# Patient Record
Sex: Female | Born: 1965
Health system: Southern US, Community
[De-identification: ages and names within clinical notes are randomized; demographics above are authoritative.]

## PROBLEM LIST (undated history)

## (undated) DIAGNOSIS — O99345 Other mental disorders complicating the puerperium: Secondary | ICD-10-CM

## (undated) DIAGNOSIS — D1803 Hemangioma of intra-abdominal structures: Secondary | ICD-10-CM

## (undated) DIAGNOSIS — R87810 Cervical high risk human papillomavirus (HPV) DNA test positive: Secondary | ICD-10-CM

## (undated) DIAGNOSIS — D509 Iron deficiency anemia, unspecified: Secondary | ICD-10-CM

## (undated) DIAGNOSIS — F329 Major depressive disorder, single episode, unspecified: Secondary | ICD-10-CM

## (undated) DIAGNOSIS — F53 Postpartum depression: Secondary | ICD-10-CM

## (undated) HISTORY — DX: Other mental disorders complicating the puerperium: O99.345

## (undated) HISTORY — DX: Cervical high risk human papillomavirus (HPV) DNA test positive: R87.810

## (undated) HISTORY — PX: ECTOPIC PREGNANCY SURGERY: SHX613

## (undated) HISTORY — DX: Iron deficiency anemia, unspecified: D50.9

## (undated) HISTORY — DX: Major depressive disorder, single episode, unspecified: F32.9

## (undated) HISTORY — DX: Postpartum depression: F53.0

## (undated) HISTORY — DX: Hemangioma of intra-abdominal structures: D18.03

---

## 1998-03-15 DIAGNOSIS — F53 Postpartum depression: Secondary | ICD-10-CM

## 1998-03-15 HISTORY — DX: Postpartum depression: F53.0

## 1998-11-06 ENCOUNTER — Inpatient Hospital Stay (HOSPITAL_COMMUNITY): Admission: AD | Admit: 1998-11-06 | Discharge: 1998-11-08 | Payer: Self-pay | Admitting: Obstetrics & Gynecology

## 1999-05-13 ENCOUNTER — Other Ambulatory Visit: Admission: RE | Admit: 1999-05-13 | Discharge: 1999-05-13 | Payer: Self-pay | Admitting: Obstetrics and Gynecology

## 2000-12-27 ENCOUNTER — Other Ambulatory Visit: Admission: RE | Admit: 2000-12-27 | Discharge: 2000-12-27 | Payer: Self-pay | Admitting: Obstetrics and Gynecology

## 2004-07-11 ENCOUNTER — Emergency Department (HOSPITAL_COMMUNITY): Admission: EM | Admit: 2004-07-11 | Discharge: 2004-07-11 | Payer: Self-pay | Admitting: Emergency Medicine

## 2007-12-28 ENCOUNTER — Other Ambulatory Visit: Admission: RE | Admit: 2007-12-28 | Discharge: 2007-12-28 | Payer: Self-pay | Admitting: Family Medicine

## 2010-04-05 ENCOUNTER — Encounter: Payer: Self-pay | Admitting: Family Medicine

## 2012-04-21 ENCOUNTER — Emergency Department (HOSPITAL_COMMUNITY)
Admission: EM | Admit: 2012-04-21 | Discharge: 2012-04-21 | Disposition: A | Discharge status: Home / Self Care | Payer: Managed Care, Other (non HMO) | Attending: Emergency Medicine | Admitting: Emergency Medicine

## 2012-04-21 ENCOUNTER — Encounter (HOSPITAL_COMMUNITY): Payer: Self-pay | Admitting: Emergency Medicine

## 2012-04-21 DIAGNOSIS — M25519 Pain in unspecified shoulder: Secondary | ICD-10-CM | POA: Insufficient documentation

## 2012-04-21 DIAGNOSIS — M542 Cervicalgia: Secondary | ICD-10-CM

## 2012-04-21 DIAGNOSIS — F17200 Nicotine dependence, unspecified, uncomplicated: Secondary | ICD-10-CM | POA: Insufficient documentation

## 2012-04-21 DIAGNOSIS — Z566 Other physical and mental strain related to work: Secondary | ICD-10-CM

## 2012-04-21 DIAGNOSIS — R209 Unspecified disturbances of skin sensation: Secondary | ICD-10-CM | POA: Insufficient documentation

## 2012-04-21 DIAGNOSIS — M79609 Pain in unspecified limb: Secondary | ICD-10-CM | POA: Insufficient documentation

## 2012-04-21 DIAGNOSIS — M62838 Other muscle spasm: Secondary | ICD-10-CM

## 2012-04-21 DIAGNOSIS — Z79899 Other long term (current) drug therapy: Secondary | ICD-10-CM | POA: Insufficient documentation

## 2012-04-21 DIAGNOSIS — F1010 Alcohol abuse, uncomplicated: Secondary | ICD-10-CM | POA: Insufficient documentation

## 2012-04-21 MED ORDER — IBUPROFEN 800 MG PO TABS: 800.0000 mg | ORAL_TABLET | Freq: Three times a day (TID) | ORAL | Status: DC

## 2012-04-21 MED ORDER — CYCLOBENZAPRINE HCL 10 MG PO TABS: 10.0000 mg | ORAL_TABLET | Freq: Two times a day (BID) | ORAL | Status: DC | PRN

## 2012-04-21 NOTE — ED Provider Notes (Signed)
History   This chart was scribed for non-physician practitioner working with Celene Kras, MD by Leone Payor, ED Scribe. This patient was seen in room WTR6/WTR6 and the patient's care was started at 2104.   CSN: [id number]  Arrival date & time 04/21/12  2104   First MD Initiated Contact with Patient 04/21/12 2141      Chief Complaint  Patient presents with  . Torticollis     The history is provided by the patient. No language interpreter was used.    Priscilla Bautista is a 47 y.o. female who presents to the Emergency Department complaining of constant, gradually worsening R sided neck stiffness with shoulder and R arm pain with numbness and tingling to R hand starting 7-10 days ago. Pt denies recent injury to the area. States pain is better with alcohol. She has had 3-4 drinks tonight. Pain prior to coming to ED was 3-4/10 but currently is 4-5/10. Has an associated HA but denies fever, chills. States she is under a great amount of stress at work and is constantly straining her neck and eyes looking at a computer screen and blackberry. She has an appointment tomorrow for a massage.   Pt is a current everyday smoker and daily alcohol user.  History reviewed. No pertinent past medical history.  Past Surgical History  Procedure Date  . Ectopic pregnancy surgery     No family history on file.  History  Substance Use Topics  . Smoking status: Current Every Day Smoker  . Smokeless tobacco: Not on file  . Alcohol Use: Yes     Comment: daily    No OB history provided.   Review of Systems A complete 10 system review of systems was obtained and all systems are negative except as noted in the HPI and PMH.    Allergies  Review of patient's allergies indicates no known allergies.  Home Medications   Current Outpatient Rx  Name  Route  Sig  Dispense  Refill  . ACETAMINOPHEN 500 MG PO TABS   Oral   Take 1,000 mg by mouth every 6 (six) hours as needed. For pain         . ASPIRIN  325 MG PO TABS   Oral   Take 650 mg by mouth 2 (two) times daily as needed. For pain         . IBUPROFEN 200 MG PO TABS   Oral   Take 400 mg by mouth every 6 (six) hours as needed. For pain         . ADULT MULTIVITAMIN W/MINERALS CH   Oral   Take 1 tablet by mouth every morning.         Marland Kitchen VITAMIN B-12 500 MCG PO TABS   Oral   Take 500 mcg by mouth every morning.           BP 136/94  Pulse 87  Temp 97.7 F (36.5 C) (Oral)  Resp 18  Ht 5\' 7"  (1.702 m)  Wt 175 lb (79.379 kg)  BMI 27.41 kg/m2  SpO2 98%  LMP 03/21/2012  Physical Exam  Nursing note and vitals reviewed. Constitutional: She is oriented to person, place, and time. She appears well-developed and well-nourished. No distress.       ETOH odor  HENT:  Head: Normocephalic and atraumatic.  Eyes: Conjunctivae normal and EOM are normal. Pupils are equal, round, and reactive to light.  Neck: Normal range of motion. Neck supple. No spinous process tenderness present.  No rigidity. No tracheal deviation and no edema present. No Brudzinski's sign and no Kernig's sign noted.  Cardiovascular: Normal rate, regular rhythm, normal heart sounds and intact distal pulses.   Pulmonary/Chest: Effort normal and breath sounds normal. No respiratory distress. She has no wheezes. She has no rales. She exhibits no tenderness.  Musculoskeletal: Normal range of motion.       Cervical back: She exhibits tenderness. She exhibits normal range of motion, no bony tenderness and normal pulse.       Tenderness to palpation of bilateral trapezius muscle with spasm.  Neurological: She is alert and oriented to person, place, and time. She has normal strength. No sensory deficit.  Skin: Skin is warm and dry.  Psychiatric: She has a normal mood and affect. Her behavior is normal.    ED Course  Procedures (including critical care time)  DIAGNOSTIC STUDIES: Oxygen Saturation is 98% on room air, normal by my interpretation.    COORDINATION OF  CARE:  9:52 PM Discussed treatment plan which includes muscle relaxer, ibuprofen with pt at bedside and pt agreed to plan.    Labs Reviewed - No data to display No results found.   1. Muscle spasm   2. Stress at work   3. Neck pain       MDM  47 y/o female under a lot of stress at work presenting with neck spasms. No red flags concerning patient's neck pain. No focal neurologic deficits. Negative meningeal signs. Patient is currently slightly intoxicated. She has a massage scheduled tomorrow. I advised rest and heat. I prescribed her Flexeril and ibuprofen. I explained to her that I could not give her Flexeril the emergency department since she did admit to drinking a good amount of alcohol tonight. Return precautions discussed. Patient states her understanding of plan and is agreeable.   I personally performed the services described in this documentation, which was scribed in my presence. The recorded information has been reviewed and is accurate.     Trevor Mace, PA-C 04/21/12 2211

## 2012-04-21 NOTE — ED Provider Notes (Signed)
Medical screening examination/treatment/procedure(s) were performed by non-physician practitioner and as supervising physician I was immediately available for consultation/collaboration.   Celene Kras, MD 04/21/12 2218

## 2012-04-21 NOTE — ED Notes (Signed)
Pt c/o R sided neck stiffness with shoulder and R arm pain with numbness/tingling to R hand. Pt states pain worse when lifts R arm, then feels numbness to R elbow, onset 7-10 days. Pt denies injury. Pt also c/o intermittent blurred vision and HA.  Pt denies n/v/d, denies fever, denies diaphoresis or SHOB.

## 2013-03-15 DIAGNOSIS — D1803 Hemangioma of intra-abdominal structures: Secondary | ICD-10-CM

## 2013-03-15 HISTORY — DX: Hemangioma of intra-abdominal structures: D18.03

## 2013-08-13 DIAGNOSIS — R87810 Cervical high risk human papillomavirus (HPV) DNA test positive: Secondary | ICD-10-CM

## 2013-08-13 HISTORY — DX: Cervical high risk human papillomavirus (HPV) DNA test positive: R87.810

## 2013-09-12 DIAGNOSIS — D509 Iron deficiency anemia, unspecified: Secondary | ICD-10-CM

## 2013-09-12 DIAGNOSIS — F32A Depression, unspecified: Secondary | ICD-10-CM

## 2013-09-12 HISTORY — DX: Depression, unspecified: F32.A

## 2013-09-12 HISTORY — DX: Iron deficiency anemia, unspecified: D50.9

## 2013-10-04 ENCOUNTER — Ambulatory Visit (INDEPENDENT_AMBULATORY_CARE_PROVIDER_SITE_OTHER): Payer: Managed Care, Other (non HMO) | Admitting: Family Medicine

## 2013-10-04 ENCOUNTER — Encounter: Payer: Self-pay | Admitting: Family Medicine

## 2013-10-04 ENCOUNTER — Other Ambulatory Visit: Payer: Self-pay | Admitting: Family Medicine

## 2013-10-04 VITALS — BP 120/88 | HR 80 | Ht 65.25 in | Wt 195.0 lb

## 2013-10-04 DIAGNOSIS — F3289 Other specified depressive episodes: Secondary | ICD-10-CM

## 2013-10-04 DIAGNOSIS — F325 Major depressive disorder, single episode, in full remission: Secondary | ICD-10-CM | POA: Insufficient documentation

## 2013-10-04 DIAGNOSIS — R5381 Other malaise: Secondary | ICD-10-CM

## 2013-10-04 DIAGNOSIS — J309 Allergic rhinitis, unspecified: Secondary | ICD-10-CM | POA: Insufficient documentation

## 2013-10-04 DIAGNOSIS — F329 Major depressive disorder, single episode, unspecified: Secondary | ICD-10-CM

## 2013-10-04 DIAGNOSIS — Z23 Encounter for immunization: Secondary | ICD-10-CM

## 2013-10-04 DIAGNOSIS — R5383 Other fatigue: Secondary | ICD-10-CM

## 2013-10-04 DIAGNOSIS — Z0000 Encounter for general adult medical examination without abnormal findings: Secondary | ICD-10-CM

## 2013-10-04 DIAGNOSIS — F17200 Nicotine dependence, unspecified, uncomplicated: Secondary | ICD-10-CM | POA: Insufficient documentation

## 2013-10-04 LAB — COMPREHENSIVE METABOLIC PANEL
ALBUMIN: 4 g/dL (ref 3.5–5.2)
ALK PHOS: 51 U/L (ref 39–117)
ALT: 20 U/L (ref 0–35)
AST: 19 U/L (ref 0–37)
BILIRUBIN TOTAL: 0.3 mg/dL (ref 0.2–1.2)
BUN: 8 mg/dL (ref 6–23)
CO2: 26 mEq/L (ref 19–32)
CREATININE: 0.68 mg/dL (ref 0.50–1.10)
Calcium: 8.8 mg/dL (ref 8.4–10.5)
Chloride: 107 mEq/L (ref 96–112)
Glucose, Bld: 92 mg/dL (ref 70–99)
POTASSIUM: 4.6 meq/L (ref 3.5–5.3)
SODIUM: 141 meq/L (ref 135–145)
Total Protein: 6.6 g/dL (ref 6.0–8.3)

## 2013-10-04 LAB — CBC WITH DIFFERENTIAL/PLATELET
Basophils Absolute: 0 10*3/uL (ref 0.0–0.1)
Basophils Relative: 1 % (ref 0–1)
Eosinophils Absolute: 0.1 10*3/uL (ref 0.0–0.7)
Eosinophils Relative: 3 % (ref 0–5)
HCT: 31.1 % — ABNORMAL LOW (ref 36.0–46.0)
Hemoglobin: 9.4 g/dL — ABNORMAL LOW (ref 12.0–15.0)
Lymphocytes Relative: 32 % (ref 12–46)
Lymphs Abs: 1.4 10*3/uL (ref 0.7–4.0)
MCH: 20 pg — AB (ref 26.0–34.0)
MCHC: 30.2 g/dL (ref 30.0–36.0)
MCV: 66.3 fL — AB (ref 78.0–100.0)
Monocytes Absolute: 0.4 10*3/uL (ref 0.1–1.0)
Monocytes Relative: 8 % (ref 3–12)
NEUTROS PCT: 56 % (ref 43–77)
Neutro Abs: 2.5 10*3/uL (ref 1.7–7.7)
PLATELETS: 352 10*3/uL (ref 150–400)
RBC: 4.69 MIL/uL (ref 3.87–5.11)
RDW: 18.5 % — AB (ref 11.5–15.5)
WBC: 4.4 10*3/uL (ref 4.0–10.5)

## 2013-10-04 LAB — POCT URINALYSIS DIPSTICK
Bilirubin, UA: NEGATIVE
Blood, UA: NEGATIVE
Glucose, UA: NEGATIVE
Ketones, UA: NEGATIVE
Nitrite, UA: NEGATIVE
PROTEIN UA: NEGATIVE
Spec Grav, UA: 1.02
UROBILINOGEN UA: NEGATIVE
pH, UA: 5

## 2013-10-04 LAB — LIPID PANEL
CHOLESTEROL: 129 mg/dL (ref 0–200)
HDL: 62 mg/dL (ref >39)
LDL CALC: 52 mg/dL (ref 0–99)
TRIGLYCERIDES: 74 mg/dL (ref <150)
Total CHOL/HDL Ratio: 2.1 Ratio
VLDL: 15 mg/dL (ref 0–40)

## 2013-10-04 MED ORDER — BUPROPION HCL ER (XL) 150 MG PO TB24: ORAL_TABLET | ORAL | Status: DC

## 2013-10-04 NOTE — Patient Instructions (Addendum)
  HEALTH MAINTENANCE RECOMMENDATIONS:  It is recommended that you get at least 30 minutes of aerobic exercise at least 5 days/week (for weight loss, you may need as much as 60-90 minutes). This can be any activity that gets your heart rate up. This can be divided in 10-15 minute intervals if needed, but try and build up your endurance at least once a week.  Weight bearing exercise is also recommended twice weekly.  Eat a healthy diet with lots of vegetables, fruits and fiber.  "Colorful" foods have a lot of vitamins (ie green vegetables, tomatoes, red peppers, etc).  Limit sweet tea, regular sodas and alcoholic beverages, all of which has a lot of calories and sugar.  Up to 1 alcoholic drink daily may be beneficial for women (unless trying to lose weight, watch sugars).  Drink a lot of water.  Calcium recommendations are 1200-1500 mg daily (1500 mg for postmenopausal women or women without ovaries), and vitamin D 1000 IU daily.  This should be obtained from diet and/or supplements (vitamins), and calcium should not be taken all at once, but in divided doses.  Monthly self breast exams and yearly mammograms for women over the age of 29 is recommended.  Sunscreen of at least SPF 30 should be used on all sun-exposed parts of the skin when outside between the hours of 10 am and 4 pm (not just when at beach or pool, but even with exercise, golf, tennis, and yard work!)  Use a sunscreen that says "broad spectrum" so it covers both UVA and UVB rays, and make sure to reapply every 1-2 hours.  Remember to change the batteries in your smoke detectors when changing your clock times in the spring and fall.  Use your seat belt every time you are in a car, and please drive safely and not be distracted with cell phones and texting while driving.   Please try and quit smoking--start thinking about why/when you smoke (habit, boredom, stress) in order to come up with effective strategies to cut back or quit.  Available resources to help you quit include free counseling through [facility] ([place]Quitline[facility] or 1-800-QUITNOW), smoking cessation classes through [facility] (call to find out schedule), over-the-counter nicotine replacements, and e-cigarettes (although this may not help break the hand-mouth habit).  Many insurance companies also have smoking cessation programs (which may decrease the cost of patches, meds if enrolled).  If these methods are not effective for you, and you are motivated to quit, return to discuss the possibility of prescription medications.  Start the Wellbutrin 1 tablet daily.  After a week, increase to two tablets (both taken together, in the morning).  Call for 300mg  tablet if your prescription won't last until your follow-up (and if tolerating taking the higher dose).
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
## 2013-10-05 ENCOUNTER — Encounter: Payer: Self-pay | Admitting: Family Medicine

## 2013-10-05 DIAGNOSIS — D509 Iron deficiency anemia, unspecified: Secondary | ICD-10-CM | POA: Insufficient documentation

## 2013-10-05 LAB — FERRITIN: FERRITIN: 2 ng/mL — AB (ref 10–291)

## 2013-10-05 LAB — IRON: IRON: 11 ug/dL — AB (ref 42–145)

## 2013-10-05 LAB — TSH: TSH: 1.638 u[IU]/mL (ref 0.350–4.500)

## 2013-10-05 LAB — VITAMIN D 25 HYDROXY (VIT D DEFICIENCY, FRACTURES): VIT D 25 HYDROXY: 42 ng/mL (ref 30–89)

## 2013-10-10 ENCOUNTER — Other Ambulatory Visit: Payer: Self-pay | Admitting: *Deleted

## 2013-10-10 DIAGNOSIS — D509 Iron deficiency anemia, unspecified: Secondary | ICD-10-CM

## 2013-11-13 ENCOUNTER — Telehealth: Payer: Self-pay | Admitting: Family Medicine

## 2013-11-13 NOTE — Telephone Encounter (Signed)
I tried to call this patient twice and I was unable to get in touch with her so I am forwarding this message to you. CLS

## 2013-11-13 NOTE — Telephone Encounter (Signed)
She has an appointment later this week--okay to refill since she is out.  But this message isn't clear as to the dose she is requesting refill on.  I prescribed 150mg , to be increased to 2 tablets daily after a week, and then planned to prescribe a 300mg  tablet when refills needed (rather than 150).  Please verify that she did in fact increase dose to 300mg , then okay for #30of the 300mg  tablet.  If for some reason she stayed on 150mg , and is doing well there, then okay to refill #30 of the 150mg .  I will see her later this week for f/u

## 2013-11-13 NOTE — Telephone Encounter (Signed)
LMOM TO CB. CLS 

## 2013-11-14 MED ORDER — BUPROPION HCL ER (XL) 300 MG PO TB24: 300.0000 mg | ORAL_TABLET | Freq: Every day | ORAL | Status: DC

## 2013-11-14 NOTE — Telephone Encounter (Signed)
Spoke with patient and she is taking the 300mg , sent rx to pharmacy for 300mg  #30-also just an FYI she had to r/s appt to next Wed 11/21/13.

## 2013-11-14 NOTE — Telephone Encounter (Signed)
Left message for patient to return my call.

## 2013-11-15 ENCOUNTER — Ambulatory Visit: Payer: Managed Care, Other (non HMO) | Admitting: Family Medicine

## 2013-11-21 ENCOUNTER — Ambulatory Visit: Payer: Managed Care, Other (non HMO) | Admitting: Family Medicine

## 2013-11-30 ENCOUNTER — Ambulatory Visit (INDEPENDENT_AMBULATORY_CARE_PROVIDER_SITE_OTHER): Payer: Managed Care, Other (non HMO) | Admitting: Medical

## 2013-11-30 ENCOUNTER — Encounter: Payer: Self-pay | Admitting: Medical

## 2013-11-30 VITALS — BP 128/80 | HR 72 | Temp 98.2°F | Resp 16 | Wt 197.0 lb

## 2013-11-30 DIAGNOSIS — J029 Acute pharyngitis, unspecified: Secondary | ICD-10-CM

## 2013-11-30 DIAGNOSIS — K3189 Other diseases of stomach and duodenum: Secondary | ICD-10-CM

## 2013-11-30 DIAGNOSIS — R12 Heartburn: Secondary | ICD-10-CM

## 2013-11-30 DIAGNOSIS — R1013 Epigastric pain: Secondary | ICD-10-CM

## 2013-11-30 MED ORDER — OMEPRAZOLE 40 MG PO CPDR: DELAYED_RELEASE_CAPSULE | ORAL | Status: DC

## 2013-11-30 NOTE — Progress Notes (Signed)
Subjective: Has had sore throat for 5-6 weeks.  Started with irritation, felt like something stuck in throat.  Got some better, then worse again. Feels dry, won't completely go away.   When really bad feels pressure and burping a lot.  Is under stress.  At times heartburn.  Denies runny nose, sneezing, ear pressure, itchy or watery eyes.   Denies epigastric pain.  Has had some heartburn.  No cough.  Worse throughout the day.  Gets a fair amount of caffeine, but not a lot of spicy foods/acidi foods.  Drinks 1-3 glasses of wine on a given night but not daily.   Sometimes eats close to bedtime.   Denies fever, fatigue.  +daughter and husband recently has had colds.  She does note starting iron in July after anemia found on labs, and this gives her nausea, upset stomach if taken on empty stomach, and wonders if this could be causing her problem.   No other aggravating or relieving factors.  No other c/o.    ROS as in subjective  Objective: Filed Vitals:   11/30/13 1423  BP: 128/80  Pulse: 72  Temp: 98.2 F (36.8 C)  Resp: 16    General appearance: alert, no distress, WD/WN HEENT: normocephalic, sclerae anicteric, TMs pearly, nares patent, no discharge or erythema, pharynx with mild erythema Oral cavity: MMM, no lesions Neck: supple, no lymphadenopathy, no thyromegaly, no masses Heart: RRR, normal S1, S2, no murmurs Lungs: CTA bilaterally, no wheezes, rhonchi, or rales Abdomen: +bs, soft, non tender, non distended, no masses, no hepatomegaly, no splenomegaly Pulses: 2+ symmetric, upper and lower extremities, normal cap refill   Assessment: Encounter Diagnoses  Name Primary?  . Sore throat Yes  . Dyspepsia   . Heartburn    Plan: Discussed possible causes.  I suspect dyspepsia vs GERD vs esophagitis.  Avoid triggers/limit triggers including spicy foods, acidic foods, limit alcohol, avoid eating/drinking within 2 hours of bedtime.   The iron oral could be playing a role as well.  Begin  Omeprazole, can c/t zantac for 1-2 wk.  If much improved at in 1-2 wk, can consider backing off the omeprazole and seeing how things go.   In the event iron is the bigger factor, may end up needing to try polysaccharide iron such as Tandem.  She will call back in 1 wk with symptom update.

## 2013-12-12 ENCOUNTER — Ambulatory Visit: Payer: Managed Care, Other (non HMO) | Admitting: Family Medicine

## 2013-12-16 ENCOUNTER — Other Ambulatory Visit: Payer: Self-pay | Admitting: Family Medicine

## 2013-12-18 ENCOUNTER — Ambulatory Visit (INDEPENDENT_AMBULATORY_CARE_PROVIDER_SITE_OTHER): Payer: Managed Care, Other (non HMO) | Admitting: Family Medicine

## 2013-12-18 ENCOUNTER — Encounter: Payer: Self-pay | Admitting: Family Medicine

## 2013-12-18 VITALS — BP 118/76 | HR 70 | Wt 195.0 lb

## 2013-12-18 DIAGNOSIS — N390 Urinary tract infection, site not specified: Secondary | ICD-10-CM

## 2013-12-18 LAB — POCT URINALYSIS DIPSTICK
BILIRUBIN UA: NEGATIVE
GLUCOSE UA: NEGATIVE
KETONES UA: NEGATIVE
PH UA: 5
SPEC GRAV UA: 1.025
Urobilinogen, UA: NEGATIVE

## 2013-12-18 MED ORDER — SULFAMETHOXAZOLE-TMP DS 800-160 MG PO TABS: 1.0000 | ORAL_TABLET | Freq: Two times a day (BID) | ORAL | Status: DC

## 2013-12-18 NOTE — Progress Notes (Signed)
   Subjective:    Patient ID: Priscilla Bautista, female    DOB: 29-Aug-1965, 48 y.o.   MRN: [id number]  HPI She complains of difficulty over the last several weeks with intermittent hip, back and lower abdominal discomfort. This would tend to get better after she empties her bladder. She's had no fever, chills, dysuria or urgency.   Review of Systems     Objective:   Physical Exam Alert and in no distress. Urine microscopic did show epithelial cells as well as some white cells and bacteria       Assessment & Plan:  UTI (lower urinary tract infection) - Plan: POCT Urinalysis Dipstick, sulfamethoxazole-trimethoprim (BACTRIM DS) 800-160 MG per tablet  I will treat her however I warned her that if her symptoms didn't go away, further evaluation will be needed.

## 2013-12-23 ENCOUNTER — Encounter (HOSPITAL_COMMUNITY): Payer: Self-pay | Admitting: Emergency Medicine

## 2013-12-23 ENCOUNTER — Emergency Department (HOSPITAL_COMMUNITY): Payer: Managed Care, Other (non HMO)

## 2013-12-23 ENCOUNTER — Emergency Department (HOSPITAL_COMMUNITY)
Admission: EM | Admit: 2013-12-23 | Discharge: 2013-12-23 | Disposition: A | Discharge status: Home / Self Care | Payer: Managed Care, Other (non HMO) | Attending: Emergency Medicine | Admitting: Emergency Medicine

## 2013-12-23 DIAGNOSIS — R197 Diarrhea, unspecified: Secondary | ICD-10-CM | POA: Insufficient documentation

## 2013-12-23 DIAGNOSIS — Z720 Tobacco use: Secondary | ICD-10-CM | POA: Diagnosis not present

## 2013-12-23 DIAGNOSIS — R109 Unspecified abdominal pain: Secondary | ICD-10-CM | POA: Insufficient documentation

## 2013-12-23 DIAGNOSIS — Z7982 Long term (current) use of aspirin: Secondary | ICD-10-CM | POA: Diagnosis not present

## 2013-12-23 DIAGNOSIS — Z79899 Other long term (current) drug therapy: Secondary | ICD-10-CM | POA: Insufficient documentation

## 2013-12-23 DIAGNOSIS — Z8659 Personal history of other mental and behavioral disorders: Secondary | ICD-10-CM | POA: Diagnosis not present

## 2013-12-23 DIAGNOSIS — Z3202 Encounter for pregnancy test, result negative: Secondary | ICD-10-CM | POA: Diagnosis not present

## 2013-12-23 DIAGNOSIS — D509 Iron deficiency anemia, unspecified: Secondary | ICD-10-CM | POA: Diagnosis not present

## 2013-12-23 DIAGNOSIS — Z792 Long term (current) use of antibiotics: Secondary | ICD-10-CM | POA: Diagnosis not present

## 2013-12-23 DIAGNOSIS — Z9889 Other specified postprocedural states: Secondary | ICD-10-CM | POA: Insufficient documentation

## 2013-12-23 DIAGNOSIS — R1030 Lower abdominal pain, unspecified: Secondary | ICD-10-CM

## 2013-12-23 DIAGNOSIS — R110 Nausea: Secondary | ICD-10-CM | POA: Insufficient documentation

## 2013-12-23 LAB — URINALYSIS, ROUTINE W REFLEX MICROSCOPIC
BILIRUBIN URINE: NEGATIVE
Glucose, UA: NEGATIVE mg/dL
Hgb urine dipstick: NEGATIVE
Ketones, ur: NEGATIVE mg/dL
Leukocytes, UA: NEGATIVE
Nitrite: NEGATIVE
PROTEIN: NEGATIVE mg/dL
Specific Gravity, Urine: 1.026 (ref 1.005–1.030)
UROBILINOGEN UA: 0.2 mg/dL (ref 0.0–1.0)
pH: 5.5 (ref 5.0–8.0)

## 2013-12-23 LAB — COMPREHENSIVE METABOLIC PANEL
ALBUMIN: 3.9 g/dL (ref 3.5–5.2)
ALK PHOS: 61 U/L (ref 39–117)
ALT: 30 U/L (ref 0–35)
ANION GAP: 11 (ref 5–15)
AST: 26 U/L (ref 0–37)
BUN: 11 mg/dL (ref 6–23)
CO2: 25 meq/L (ref 19–32)
CREATININE: 0.88 mg/dL (ref 0.50–1.10)
Calcium: 9 mg/dL (ref 8.4–10.5)
Chloride: 104 mEq/L (ref 96–112)
GFR calc Af Amer: 90 mL/min — ABNORMAL LOW (ref >90)
GFR, EST NON AFRICAN AMERICAN: 78 mL/min — AB (ref >90)
Glucose, Bld: 106 mg/dL — ABNORMAL HIGH (ref 70–99)
POTASSIUM: 4.3 meq/L (ref 3.7–5.3)
SODIUM: 140 meq/L (ref 137–147)
Total Protein: 7.1 g/dL (ref 6.0–8.3)

## 2013-12-23 LAB — CBC WITH DIFFERENTIAL/PLATELET
BASOS ABS: 0.1 10*3/uL (ref 0.0–0.1)
BASOS PCT: 1 % (ref 0–1)
Eosinophils Absolute: 0.2 10*3/uL (ref 0.0–0.7)
Eosinophils Relative: 4 % (ref 0–5)
HEMATOCRIT: 38.8 % (ref 36.0–46.0)
Hemoglobin: 12.9 g/dL (ref 12.0–15.0)
Lymphocytes Relative: 30 % (ref 12–46)
Lymphs Abs: 1.7 10*3/uL (ref 0.7–4.0)
MCH: 27.3 pg (ref 26.0–34.0)
MCHC: 33.2 g/dL (ref 30.0–36.0)
MCV: 82.2 fL (ref 78.0–100.0)
MONO ABS: 0.5 10*3/uL (ref 0.1–1.0)
MONOS PCT: 10 % (ref 3–12)
NEUTROS PCT: 55 % (ref 43–77)
Neutro Abs: 3 10*3/uL (ref 1.7–7.7)
Platelets: 284 10*3/uL (ref 150–400)
RBC: 4.72 MIL/uL (ref 3.87–5.11)
RDW: 20.7 % — AB (ref 11.5–15.5)
WBC: 5.5 10*3/uL (ref 4.0–10.5)

## 2013-12-23 LAB — WET PREP, GENITAL
Clue Cells Wet Prep HPF POC: NONE SEEN
Trich, Wet Prep: NONE SEEN
Yeast Wet Prep HPF POC: NONE SEEN

## 2013-12-23 LAB — POC URINE PREG, ED: Preg Test, Ur: NEGATIVE

## 2013-12-23 MED ORDER — HYDROCODONE-ACETAMINOPHEN 5-325 MG PO TABS: 1.0000 | ORAL_TABLET | ORAL | Status: DC | PRN

## 2013-12-23 NOTE — ED Provider Notes (Signed)
CSN: [id number]     Arrival date & time 12/23/13  1534 History   First MD Initiated Contact with Patient 12/23/13 1541     Chief Complaint  Patient presents with  . Flank Pain  . Abdominal Pain     (Consider location/radiation/quality/duration/timing/severity/associated sxs/prior Treatment) HPI Pt is a 48yo female with hx of iron deficiency anemia-currently taking iron pills, presenting to ED with c/o bilateral flank pain radiating into her groin and suprapubic region. Pt reports 4-6 week hx of right sided back pain that has now caused her abdominal pain. Abdominal pain started about 1 week ago. Gradually worsening. Pain is 8/10 at worst, does improve with Advil. Pt was seen by PCP 1 week ago, dx with UTI, started on bactrim but states symptoms have not improved. Reports associated nausea, and intermittent diarrhea and constipation. Denies vaginal symptoms, dysuria or hematuria. Denies hx of renal stones. No previous hx of abdominal surgeries per pt, however, medical records indicate surgery for ectopic pregnancy.  Pt states she does have an IUD in place for about 5-6 years.  LMP: 11/23/13.  Past Medical History  Diagnosis Date  . Postpartum depression 2000    took Wellbutrin  . Iron deficiency anemia 09/2013   Past Surgical History  Procedure Laterality Date  . Ectopic pregnancy surgery     Family History  Problem Relation Age of Onset  . Hypertension Mother   . Cancer Father     multiple myeloma  . Depression Sister     bipolar possibly  . Cancer Maternal Grandmother     leukemia  . Cancer Maternal Grandfather     lung  . Stroke Paternal Grandfather   . Diabetes Neg Hx   . Heart disease Neg Hx    History  Substance Use Topics  . Smoking status: Current Every Day Smoker -- 0.30 packs/day for 26 years    Types: Cigarettes  . Smokeless tobacco: Never Used  . Alcohol Use: Yes     Comment: 1 glass of wine per night, maybe 5 glasses per week   OB History   Grav Para Term  Preterm Abortions TAB SAB Ect Mult Living   '4 2   2  1 1  2     ' Review of Systems  Constitutional: Negative for fever, chills, diaphoresis, appetite change, fatigue and unexpected weight change.  Respiratory: Negative for cough and shortness of breath.   Cardiovascular: Negative for chest pain and palpitations.  Gastrointestinal: Positive for nausea, abdominal pain ( bilateral flank pain radiating into bilateral groin, suprapubic region), diarrhea and constipation. Negative for vomiting.  Genitourinary: Positive for flank pain (bilateral). Negative for dysuria, urgency, frequency, hematuria, decreased urine volume, vaginal bleeding, vaginal discharge, vaginal pain, menstrual problem and pelvic pain.  Musculoskeletal: Positive for back pain ( right side). Negative for myalgias.  All other systems reviewed and are negative.     Allergies  Review of patient's allergies indicates no known allergies.  Home Medications   Prior to Admission medications   Medication Sig Start Date End Date Taking? Authorizing Provider  aspirin 325 MG tablet Take 650 mg by mouth 2 (two) times daily as needed. For pain   Yes Historical Provider, MD  buPROPion (WELLBUTRIN XL) 300 MG 24 hr tablet Take 300 mg by mouth daily.   Yes Historical Provider, MD  Ibuprofen (ADVIL) 200 MG CAPS Take 600 mg by mouth once as needed (pain.).   Yes Historical Provider, MD  IRON PO Take 2 tablets by mouth daily.  Yes Historical Provider, MD  Multiple Vitamin (MULTIVITAMIN WITH MINERALS) TABS Take 1 tablet by mouth every morning.   Yes Historical Provider, MD  omeprazole (PRILOSEC) 40 MG capsule 1 tablet 45 min prior to breakfast 11/30/13  Yes Camelia Eng Tysinger, PA-C  Probiotic Product (PROBIOTIC DAILY PO) Take 1 capsule by mouth daily.   Yes Historical Provider, MD  sulfamethoxazole-trimethoprim (BACTRIM DS) 800-160 MG per tablet Take 1 tablet by mouth 2 (two) times daily. 12/18/13  Yes Denita Lung, MD  vitamin B-12  (CYANOCOBALAMIN) 500 MCG tablet Take 500 mcg by mouth every morning.   Yes Historical Provider, MD  HYDROcodone-acetaminophen (NORCO/VICODIN) 5-325 MG per tablet Take 1-2 tablets by mouth every 4 (four) hours as needed for moderate pain or severe pain. 12/23/13   Noland Fordyce, PA-C   BP 120/77  Pulse 71  Temp(Src) 97.9 F (36.6 C) (Oral)  Resp 20  SpO2 100%  LMP 11/23/2013 Physical Exam  Nursing note and vitals reviewed. Constitutional: She appears well-developed and well-nourished. No distress.  Pt sitting on exam bed, appears well, NAD  HENT:  Head: Normocephalic and atraumatic.  Eyes: Conjunctivae are normal. No scleral icterus.  Neck: Normal range of motion.  Cardiovascular: Normal rate, regular rhythm and normal heart sounds.   Pulmonary/Chest: Effort normal and breath sounds normal. No respiratory distress. She has no wheezes. She has no rales. She exhibits no tenderness.  Abdominal: Soft. Bowel sounds are normal. She exhibits no distension and no mass. There is tenderness (bialteral flank and suprapubic tenderness). There is CVA tenderness ( right). There is no rebound and no guarding. Hernia confirmed negative in the right inguinal area and confirmed negative in the left inguinal area.  Soft, obese abdomen, tenderness to bilateral flank and suprapubic region. No rebound, guarding or masses. Right CVAT  Genitourinary: Uterus normal. Pelvic exam was performed with patient supine. Cervix exhibits discharge. Cervix exhibits no motion tenderness and no friability. Right adnexum displays no mass, no tenderness and no fullness. Left adnexum displays no mass, no tenderness and no fullness. No erythema, tenderness or bleeding around the vagina. No foreign body around the vagina. No signs of injury around the vagina. Vaginal discharge found.  Chaperoned exam. Small amount white-clear vaginal discharge. No CMT, adnexal tenderness or masses. No vaginal bleeding.  Musculoskeletal: Normal range of  motion.  Neurological: She is alert.  Skin: Skin is warm and dry. She is not diaphoretic.    ED Course  Procedures (including critical care time) Labs Review Labs Reviewed  WET PREP, GENITAL - Abnormal; Notable for the following:    WBC, Wet Prep HPF POC MANY (*)    All other components within normal limits  CBC WITH DIFFERENTIAL - Abnormal; Notable for the following:    RDW 20.7 (*)    All other components within normal limits  COMPREHENSIVE METABOLIC PANEL - Abnormal; Notable for the following:    Glucose, Bld 106 (*)    Total Bilirubin <0.2 (*)    GFR calc non Af Amer 78 (*)    GFR calc Af Amer 90 (*)    All other components within normal limits  URINALYSIS, ROUTINE W REFLEX MICROSCOPIC - Abnormal; Notable for the following:    APPearance CLOUDY (*)    All other components within normal limits  POC URINE PREG, ED    Imaging Review Ct Renal Stone Study  12/23/2013   CLINICAL DATA:  Right flank pain radiating to right groin. Worsening over 4 weeks. Nausea.  EXAM: CT ABDOMEN  AND PELVIS WITHOUT CONTRAST  TECHNIQUE: Multidetector CT imaging of the abdomen and pelvis was performed following the standard protocol without IV contrast.  COMPARISON:  None.  FINDINGS: Lower chest:  Unremarkable.  Hepatobiliary: Several low-attenuation liver masses are seen in the right hepatic lobe, largest measuring 2.1 cm at the anterior liver dome on image 10. These cannot be characterized without IV contrast. Gallbladder is unremarkable.  Pancreas: No mass or inflammatory process visualized on this non-contrast exam.  Spleen:  Within normal limits in size.  Adrenal Glands:  No mass identified.  Kidneys/Urinary tract: No evidence of urolithiasis or hydronephrosis.  Stomach/Bowel/Peritoneum: Unremarkable. Normal appendix visualized.  Vascular/Lymphatic: No pathologically enlarged lymph nodes identified. No other significant abnormality identified.  Reproductive: IUD noted in uterus. Adnexal regions are  unremarkable.  Other:  None.  Musculoskeletal:  No suspicious bone lesions identified.  IMPRESSION: No evidence of urolithiasis, hydronephrosis, or other acute findings.  Several indeterminate low attenuation liver masses, largest measuring approximately 2 cm. Recommend further imaging characterization with abdomen MRI without and with contrast as the preferred exam. This can be performed non emergently as an outpatient.   Electronically Signed   By: Earle Gell M.D.   On: 12/23/2013 18:42     EKG Interpretation None      MDM   Final diagnoses:  Bilateral flank pain  Lower abdominal pain    Pt is a 48yo female c/o right sided back pain that's developed into bilateral flank pain, radiating into suprapubic region.  Dx with UTI last week by PCP, started on bactrim. States no improvement with symptoms. On exam, pt appears well, non-toxic, NAD. Abdominal exam: soft, non-distended, diffuse tenderness worse in flanks and suprapubic region. No rebound, guarding or masses.   Pt declined pain or nausea medication upon arrival to ED.   Pelvic exam: white-clear vaginal discharge. No CMT, adnexal tenderness or masses. DDx pyelonephritis, renal stones, constipation, uterine fibroids, low concern for ovarian torsion due to bilateral pain. Low concern for cholecystitis or appendicitis due to no focalized pain.    Labs: unremarkable. CT abd: no evidence of urolithiasis, hydronephrosis, or other acute findings.  Several indeterminate low attenuation liver masses.  Recommend f/u MRI, discussed findings with pt. Advised to f/u with PCP to set up MRI.   Discussed pt with Dr. Darl Householder, will encourage pt to continue bactrim, f/u with PCP and OB/GYN. Return precautions provided. Pt verbalized understanding and agreement with tx plan.     Noland Fordyce, PA-C 12/23/13 1907

## 2013-12-23 NOTE — ED Provider Notes (Signed)
Medical screening examination/treatment/procedure(s) were performed by non-physician practitioner and as supervising physician I was immediately available for consultation/collaboration.   EKG Interpretation None        Wandra Arthurs, MD 12/23/13 2336

## 2013-12-23 NOTE — ED Notes (Signed)
Pt presents with c/o bilateral flank pain and abdominal pain that radiates to her groin area. Pt reports that the pain started approx 4 weeks ago but it has gotten progressively worse. Pt reports nausea but no vomiting and some irregular bowel movements, reports that she feels she is constipated at this time.

## 2013-12-26 ENCOUNTER — Ambulatory Visit (INDEPENDENT_AMBULATORY_CARE_PROVIDER_SITE_OTHER): Payer: Managed Care, Other (non HMO) | Admitting: Family Medicine

## 2013-12-26 ENCOUNTER — Encounter: Payer: Self-pay | Admitting: Family Medicine

## 2013-12-26 VITALS — BP 118/66 | HR 72 | Ht 65.25 in | Wt 197.0 lb

## 2013-12-26 DIAGNOSIS — R9389 Abnormal findings on diagnostic imaging of other specified body structures: Secondary | ICD-10-CM | POA: Insufficient documentation

## 2013-12-26 DIAGNOSIS — F40240 Claustrophobia: Secondary | ICD-10-CM

## 2013-12-26 DIAGNOSIS — F325 Major depressive disorder, single episode, in full remission: Secondary | ICD-10-CM

## 2013-12-26 DIAGNOSIS — Z720 Tobacco use: Secondary | ICD-10-CM

## 2013-12-26 DIAGNOSIS — F324 Major depressive disorder, single episode, in partial remission: Secondary | ICD-10-CM

## 2013-12-26 DIAGNOSIS — F17200 Nicotine dependence, unspecified, uncomplicated: Secondary | ICD-10-CM

## 2013-12-26 DIAGNOSIS — N3000 Acute cystitis without hematuria: Secondary | ICD-10-CM

## 2013-12-26 DIAGNOSIS — K5900 Constipation, unspecified: Secondary | ICD-10-CM

## 2013-12-26 DIAGNOSIS — D509 Iron deficiency anemia, unspecified: Secondary | ICD-10-CM

## 2013-12-26 DIAGNOSIS — R938 Abnormal findings on diagnostic imaging of other specified body structures: Secondary | ICD-10-CM

## 2013-12-26 MED ORDER — ALPRAZOLAM 0.25 MG PO TABS: ORAL_TABLET | ORAL | Status: DC

## 2013-12-26 MED ORDER — BUPROPION HCL ER (XL) 300 MG PO TB24: 300.0000 mg | ORAL_TABLET | Freq: Every day | ORAL | Status: DC

## 2013-12-26 NOTE — Patient Instructions (Addendum)
Take the iron only once daily, only during the week of menses, along with stool softener (ie Colace).  Your anemia has resolved, but ongoing heavy periods will lead to continue iron losses.  Continue high iron foods in your diet.  We will arrange for MRI to further evaluate the liver.  Take a xanax 20 minutes prior to test, and repeat dose at time of visit if still having a lot of anxiety.  You need to have someone drive you because of the sedating effects of this medication.  Continue the Wellbutrin.  Constipation Constipation is when a person has fewer than three bowel movements a week, has difficulty having a bowel movement, or has stools that are dry, hard, or larger than normal. As people grow older, constipation is more common. If you try to fix constipation with medicines that make you have a bowel movement (laxatives), the problem may get worse. Long-term laxative use may cause the muscles of the colon to become weak. A low-fiber diet, not taking in enough fluids, and taking certain medicines may make constipation worse.  CAUSES   Certain medicines, such as antidepressants, pain medicine, iron supplements, antacids, and water pills.   Certain diseases, such as diabetes, irritable bowel syndrome (IBS), thyroid disease, or depression.   Not drinking enough water.   Not eating enough fiber-rich foods.   Stress or travel.   Lack of physical activity or exercise.   Ignoring the urge to have a bowel movement.   Using laxatives too much.  SIGNS AND SYMPTOMS   Having fewer than three bowel movements a week.   Straining to have a bowel movement.   Having stools that are hard, dry, or larger than normal.   Feeling full or bloated.   Pain in the lower abdomen.   Not feeling relief after having a bowel movement.  DIAGNOSIS  Your health care provider will take a medical history and perform a physical exam. Further testing may be done for severe constipation. Some  tests may include:  A barium enema X-ray to examine your rectum, colon, and, sometimes, your small intestine.   A sigmoidoscopy to examine your lower colon.   A colonoscopy to examine your entire colon. TREATMENT  Treatment will depend on the severity of your constipation and what is causing it. Some dietary treatments include drinking more fluids and eating more fiber-rich foods. Lifestyle treatments may include regular exercise. If these diet and lifestyle recommendations do not help, your health care provider may recommend taking over-the-counter laxative medicines to help you have bowel movements. Prescription medicines may be prescribed if over-the-counter medicines do not work.  HOME CARE INSTRUCTIONS   Eat foods that have a lot of fiber, such as fruits, vegetables, whole grains, and beans.  Limit foods high in fat and processed sugars, such as french fries, hamburgers, cookies, candies, and soda.   A fiber supplement may be added to your diet if you cannot get enough fiber from foods.   Drink enough fluids to keep your urine clear or pale yellow.   Exercise regularly or as directed by your health care provider.   Go to the restroom when you have the urge to go. Do not hold it.   Only take over-the-counter or prescription medicines as directed by your health care provider. Do not take other medicines for constipation without talking to your health care provider first.  [place] IF:   You have bright red blood in your stool.   Your constipation  lasts for more than 4 days or gets worse.   You have abdominal or rectal pain.   You have thin, pencil-like stools.   You have unexplained weight loss. MAKE SURE YOU:   Understand these instructions.  Will watch your condition.  Will get help right away if you are not doing well or get worse. Document Released: 11/28/2003 Document Revised: 03/06/2013 Document Reviewed: 12/11/2012 [facility] Patient  Information 2015 [place], [place]. This information is not intended to replace advice given to you by your health care provider. Make sure you discuss any questions you have with your health care provider.

## 2013-12-26 NOTE — Progress Notes (Signed)
Chief Complaint  Patient presents with  . Follow-up    follow up on wellbutrin. Was seen at ED 10/11 had CT for renal stone, liver mass was found on CT-MRI is suggested as follow up-would like to discuss today with you. Has not taken wellbutrin, omeprazole or iron due to the fact that one of her meds was causing constipation and stomach pain. Pt declined flu vaccine. All meds reconciled.     She was started back on Wellbutrin the end of July for symptoms of depression--crying, irritability, and some anxiety.   She started iron at the same time.  She started getting a weird feeling in her throat, thought it might be reflux.  After starting omeprazole 40mg , she started having stomach cramps and pain.  Her throat symptoms resolved with omeprazole. She then came back and saw Dr. Redmond School, who diagnosed her with bladder infection.  She went to the ER on 10/11 with worsening abdominal and back pain (flank), as well as nausea.  She was told to complete the antibiotics as prescribed by Dr. Redmond School. She has been taking the antibiotics and abdominal pain has improved.  CT done in ER was abnormal--needs MRI.  See below.  CT scan showed: IMPRESSION: No evidence of urolithiasis, hydronephrosis, or other acute findings. Several indeterminate low attenuation liver masses, largest measuring approximately 2 cm. Recommend further imaging characterization with abdomen MRI without and with contrast as the preferred exam. This can be performed non emergently as an outpatient.    She only stopped the wellbutrin 10/11 (day of ER visit). Up until then, it had been working great--moods were much better.  Hasn't been crying, family has noticed improvement.  Denies side effects.   She has been having constipation.  She has become somewhat inconsistent in taking the iron. She is considering having the IUD removed.  She was taking iron daily due to iron deficiency anemia.  Labs done in ER show that anemia has improved.  Having  right sided SI joint pain today--plans to see chiropractor.  No further flank or abdominal pain.  She quit smoking 10 days ago. She had tapered down when she started the Wellbutrin, but finally quit 10/4.  Past Medical History  Diagnosis Date  . Postpartum depression 2000    took Wellbutrin  . Iron deficiency anemia 09/2013  . Depression 09/2013   Past Surgical History  Procedure Laterality Date  . Ectopic pregnancy surgery     History   Social History  . Marital Status: Married    Spouse Name: Laurey Arrow    Number of Children: 2  . Years of Education: N/A   Occupational History  . 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 Smoking status: Former Smoker -- 0.30 packs/day for 26 years    Types: Cigarettes    Quit date: 12/16/2013  . Smokeless tobacco: Never Used  . Alcohol Use: Yes     Comment: 1 glass of wine per night, maybe 5 glasses per week  . Drug Use: No  . Sexual Activity: Yes    Partners: Male    Birth Control/ Protection: IUD   Other Topics Concern  . Not on file   Social History Narrative   Married, 2 daughters.     Outpatient Encounter Prescriptions as of 12/26/2013  Medication Sig Note  . Ibuprofen (ADVIL) 200 MG CAPS Take 600 mg by mouth once as needed (pain.).   Marland Kitchen Multiple Vitamin (MULTIVITAMIN WITH MINERALS) TABS Take 1 tablet by  mouth every morning.   . Probiotic Product (PROBIOTIC DAILY PO) Take 1 capsule by mouth daily.   Marland Kitchen sulfamethoxazole-trimethoprim (BACTRIM DS) 800-160 MG per tablet Take 1 tablet by mouth 2 (two) times daily. 12/26/2013: Has 2 days left  . ALPRAZolam (XANAX) 0.25 MG tablet Take 1 tablet prior to MRI.  Repeat in 30 minutes if needed   . buPROPion (WELLBUTRIN XL) 300 MG 24 hr tablet Take 1 tablet (300 mg total) by mouth daily.   Marland Kitchen HYDROcodone-acetaminophen (NORCO/VICODIN) 5-325 MG per tablet Take 1-2 tablets by mouth every 4 (four) hours as needed for moderate pain or severe pain. 12/26/2013: Not  taking  . IRON PO Take 2 tablets by mouth daily.  12/26/2013: Stopped 10/11 (had been slightly sporadic in use)  . omeprazole (PRILOSEC) 40 MG capsule 1 tablet 45 min prior to breakfast 12/26/2013: Stopped 10/11  . vitamin B-12 (CYANOCOBALAMIN) 500 MCG tablet Take 500 mcg by mouth every morning. 12/26/2013: Hasn't been taking  . [DISCONTINUED] aspirin 325 MG tablet Take 650 mg by mouth 2 (two) times daily as needed. For pain   . [DISCONTINUED] buPROPion (WELLBUTRIN XL) 300 MG 24 hr tablet Take 300 mg by mouth daily. 12/26/2013: Hasn't taken since 10/11   No Known Allergies  ROS:  Denies fevers, chills, nausea, vomiting.  Back and abdominal pain have resolved.  Reflux symptoms and throat symptoms have not recurred since stopping the omeprazole.  +constipation.  Energy has improved (which is why she stopped taking the B12).  Moods are good.  No bleeding, bruising, rash or other concerns, except as per HPI.  See HPI.  PHYSICAL EXAM: BP 118/66  Pulse 72  Ht 5' 5.25" (1.657 m)  Wt 197 lb (89.359 kg)  BMI 32.55 kg/m2  LMP 11/23/2013 Well developed, pleasant female in no distress HEENT: PERRL, conjunctiva and sclera clear Neck: no lymphadenopathy, thyromegaly or mass Heart: regular rate and rhythm 2/6 soft SEM noted Lungs: clear bilaterally Back: no CVA or spinal tenderness.  Mildly tender at R SI joint. No muscle spasm Abdomen: soft, nontender, no hepatosplenomegaly or mass Extremities: no edema, 2+ pulse Neuro: alert and oriented.  Cranial nerves intact. Normal strength, gait Psych: normal mood, affect, hygiene, grooming, speech and eye contact.  Lab Results  Component Value Date   WBC 5.5 12/23/2013   HGB 12.9 12/23/2013   HCT 38.8 12/23/2013   MCV 82.2 12/23/2013   PLT 284 12/23/2013    ASSESSMENT/PLAN:  Depression, major, in remission - significantly improved.  continue Wellbutrin - Plan: buPROPion (WELLBUTRIN XL) 300 MG 24 hr tablet  Tobacco use disorder - congratulated on  her cessation.  continue to abstain from smoking.  Abnormal finding on CT scan - refer for MRI.  Needs to be OPEN and pre-medicated due to claustrophobia - Plan: MR Abdomen W Contrast, MR Abdomen Wo Contrast  Acute cystitis without hematuria - clinically resolving. complete course of antibiotics  Anemia, iron deficiency - improved with iron.  Not tolerating due to constipation; no longer needed daily.  Take once daily only during the week of menses, along with stool softener  Claustrophobia - Plan: ALPRAZolam (XANAX) 0.25 MG tablet  Constipation, unspecified constipation type  Pt has claustrophobia--schedule for open MRI and premedicate with xanax.  Pt to have someone drive her.  Risks/side effects of meds reviewed.  Iron deficiency--s/p 8-10 weeks of iron replacement. Anemia has resolved. Not tolerating iron well.  Change to taking just during the week of her cycles. If ongoing issues with anemia, then  consider f/u with GYN to discuss other contraceptive options (ie pills which would lighten cycles, vs none).  We briefly discussed other contraceptive options.  She is not really having problems with IUD (other than heavy cycles which caused the anemia, which caused the constipation).  Depression--significantly improved on wellbutrin. Continue.  F/u 6 months, sooner prn.

## 2014-01-07 ENCOUNTER — Ambulatory Visit
Admission: RE | Admit: 2014-01-07 | Discharge: 2014-01-07 | Disposition: A | Discharge status: Home / Self Care | Payer: Managed Care, Other (non HMO) | Source: Ambulatory Visit | Attending: Family Medicine | Admitting: Family Medicine

## 2014-01-07 DIAGNOSIS — R9389 Abnormal findings on diagnostic imaging of other specified body structures: Secondary | ICD-10-CM

## 2014-01-07 MED ORDER — GADOXETATE DISODIUM 0.25 MMOL/ML IV SOLN
9.0000 mL | Freq: Once | INTRAVENOUS | Status: AC | PRN
  Administered 2014-01-07: 9 mL via INTRAVENOUS

## 2014-01-14 ENCOUNTER — Encounter: Payer: Self-pay | Admitting: Family Medicine

## 2014-05-18 ENCOUNTER — Other Ambulatory Visit: Payer: Self-pay | Admitting: Family Medicine

## 2014-10-23 ENCOUNTER — Other Ambulatory Visit: Payer: Self-pay | Admitting: Family Medicine

## 2014-11-25 ENCOUNTER — Telehealth: Payer: Self-pay | Admitting: *Deleted

## 2014-11-25 ENCOUNTER — Encounter: Payer: Managed Care, Other (non HMO) | Admitting: Family Medicine

## 2014-11-25 DIAGNOSIS — Z0000 Encounter for general adult medical examination without abnormal findings: Secondary | ICD-10-CM

## 2014-11-25 NOTE — Telephone Encounter (Signed)
She no showed a physical.  Needs a letter and to be charged for no show

## 2014-11-25 NOTE — Telephone Encounter (Signed)
This patient no showed for their appointment today.Which of the following is necessary for this patient.   A) No follow-up necessary   B) Follow-up urgent. Locate Patient Immediately.   C) Follow-up necessary. Contact patient and Schedule visit in ____ Days.   D) Follow-up Advised. Contact patient and Schedule visit in ____ Days. 

## 2014-12-03 ENCOUNTER — Encounter: Payer: Self-pay | Admitting: Family Medicine

## 2015-03-01 ENCOUNTER — Other Ambulatory Visit: Payer: Self-pay | Admitting: Family Medicine

## 2015-03-03 NOTE — Telephone Encounter (Signed)
Patient no showed her CPE in Sept and has nothing scheduled-would you like me to call her and schedule med check?

## 2015-03-03 NOTE — Telephone Encounter (Signed)
Yes please

## 2015-04-07 ENCOUNTER — Ambulatory Visit (INDEPENDENT_AMBULATORY_CARE_PROVIDER_SITE_OTHER): Payer: Managed Care, Other (non HMO) | Admitting: Family Medicine

## 2015-04-07 VITALS — BP 112/78 | HR 75 | Temp 97.8°F | Resp 16 | Ht 67.75 in | Wt 189.0 lb

## 2015-04-07 DIAGNOSIS — S61211A Laceration without foreign body of left index finger without damage to nail, initial encounter: Secondary | ICD-10-CM

## 2015-04-07 DIAGNOSIS — S61219A Laceration without foreign body of unspecified finger without damage to nail, initial encounter: Secondary | ICD-10-CM

## 2015-04-07 NOTE — Progress Notes (Signed)
[facility] [address] [place] [address] [phone]  Date:  04/07/2015   Name:  Priscilla Bautista   DOB:  Feb 29, 1966   MRN:  [id number]  PCP:  Priscilla Ports, MD    Chief Complaint: Laceration   History of Present Illness:  Priscilla Bautista is a 50 y.o. very pleasant female patient who presents with the following:  Here today as a new patient with injury- she cut her left index finger yesterday around noon- she cut it on a serrated knife.   She is right handed She is generally in good health.  No other concerns today tdap 2015 No allergies No chance of current pregnancy   Patient Active Problem List   Diagnosis Date Noted  . Abnormal finding on CT scan 12/26/2013  . Anemia, iron deficiency 10/05/2013  . Tobacco use disorder 10/04/2013  . Depressive disorder, not elsewhere classified 10/04/2013  . Allergic rhinitis, cause unspecified 10/04/2013    Past Medical History  Diagnosis Date  . Postpartum depression 2000    took Wellbutrin  . Iron deficiency anemia 09/2013  . Depression 09/2013    Past Surgical History  Procedure Laterality Date  . Ectopic pregnancy surgery      Social History  Substance Use Topics  . Smoking status: Former Smoker -- 0.30 packs/day for 26 years    Types: Cigarettes    Quit date: 12/16/2013  . Smokeless tobacco: Never Used  . Alcohol Use: Yes     Comment: 1 glass of wine per night, maybe 5 glasses per week    Family History  Problem Relation Age of Onset  . Hypertension Mother   . Cancer Father     multiple myeloma  . Depression Sister     bipolar possibly  . Cancer Maternal Grandmother     leukemia  . Cancer Maternal Grandfather     lung  . Stroke Paternal Grandfather   . Diabetes Neg Hx   . Heart disease Neg Hx     No Known Allergies  Medication list has been reviewed and updated.  Current Outpatient Prescriptions on File Prior to Visit  Medication Sig Dispense Refill  . buPROPion (WELLBUTRIN  XL) 300 MG 24 hr tablet TAKE 1 TABLET (300 MG TOTAL) BY MOUTH DAILY. 90 tablet 0  . Multiple Vitamin (MULTIVITAMIN WITH MINERALS) TABS Take 1 tablet by mouth every morning. Reported on 04/07/2015    . ALPRAZolam (XANAX) 0.25 MG tablet Take 1 tablet prior to MRI.  Repeat in 30 minutes if needed (Patient not taking: Reported on 04/07/2015) 5 tablet 0  . HYDROcodone-acetaminophen (NORCO/VICODIN) 5-325 MG per tablet Take 1-2 tablets by mouth every 4 (four) hours as needed for moderate pain or severe pain. (Patient not taking: Reported on 04/07/2015) 15 tablet 0  . Ibuprofen (ADVIL) 200 MG CAPS Take 600 mg by mouth once as needed (pain.). Reported on 04/07/2015    . IRON PO Take 2 tablets by mouth daily. Reported on 04/07/2015    . omeprazole (PRILOSEC) 40 MG capsule 1 tablet 45 min prior to breakfast (Patient not taking: Reported on 04/07/2015) 30 capsule 1  . Probiotic Product (PROBIOTIC DAILY PO) Take 1 capsule by mouth daily. Reported on 04/07/2015    . sulfamethoxazole-trimethoprim (BACTRIM DS) 800-160 MG per tablet Take 1 tablet by mouth 2 (two) times daily. (Patient not taking: Reported on 04/07/2015) 20 tablet 0  . vitamin B-12 (CYANOCOBALAMIN) 500 MCG tablet Take 500 mcg by mouth every morning.  Reported on 04/07/2015     No current facility-administered medications on file prior to visit.    Review of Systems:  As per HPI- otherwise negative.   Physical Examination: Filed Vitals:   04/07/15 0858  BP: 112/78  Pulse: 75  Temp: 97.8 F (36.6 C)  Resp: 16   Filed Vitals:   04/07/15 0858  Height: 5' 7.75" (1.721 m)  Weight: 189 lb (85.73 kg)   Body mass index is 28.94 kg/(m^2). Ideal Body Weight: Weight in (lb) to have BMI = 25: 162.9  GEN: WDWN, NAD, Non-toxic, A & O x 3, well appearing  HEENT: Atraumatic, Normocephalic. Neck supple. No masses, No LAD. Ears and Nose: No external deformity. EXTR: No c/c/e NEURO Normal gait.  PSYCH: Normally interactive. Conversant. Not depressed or  anxious appearing.  Calm demeanor.  Left index finger: she has avulsed the skin over the proximal and middle phalanges on the radial side.  Wound goes just through the dermis. Normal sensation and strength to flexion and extension distal to the wound  Wound care by Harrison Mons- wound was washed with soap and water, applied xeroform gauze to the wound and dressed   Assessment and Plan: Laceration of finger, left, initial encounter  Treated as above.  Discussed wound care.  She will follow-up if any concerns  Wash wound daily with soap and water. Apply a new piece of xeroform as needed,  Allow open to air when possible  Signed Lamar Blinks, MD

## 2015-04-07 NOTE — Patient Instructions (Signed)
Let us know if you have any problems with your wound or any sign of infection such as redness, swelling or pus.  Keep a clean, dry bandage on the wound as needed but you may shower and then apply a clean bandage

## 2015-04-21 ENCOUNTER — Ambulatory Visit (INDEPENDENT_AMBULATORY_CARE_PROVIDER_SITE_OTHER): Payer: Managed Care, Other (non HMO) | Admitting: Family Medicine

## 2015-04-21 ENCOUNTER — Encounter: Payer: Self-pay | Admitting: Family Medicine

## 2015-04-21 VITALS — BP 102/62 | HR 68 | Ht 66.0 in | Wt 186.0 lb

## 2015-04-21 DIAGNOSIS — F325 Major depressive disorder, single episode, in full remission: Secondary | ICD-10-CM | POA: Diagnosis not present

## 2015-04-21 DIAGNOSIS — D509 Iron deficiency anemia, unspecified: Secondary | ICD-10-CM

## 2015-04-21 LAB — CBC WITH DIFFERENTIAL/PLATELET
BASOS ABS: 0.1 10*3/uL (ref 0.0–0.1)
BASOS PCT: 1 % (ref 0–1)
Eosinophils Absolute: 0.1 10*3/uL (ref 0.0–0.7)
Eosinophils Relative: 2 % (ref 0–5)
HCT: 40.1 % (ref 36.0–46.0)
HEMOGLOBIN: 13.6 g/dL (ref 12.0–15.0)
Lymphocytes Relative: 26 % (ref 12–46)
Lymphs Abs: 1.9 10*3/uL (ref 0.7–4.0)
MCH: 28.8 pg (ref 26.0–34.0)
MCHC: 33.9 g/dL (ref 30.0–36.0)
MCV: 85 fL (ref 78.0–100.0)
MPV: 9.8 fL (ref 8.6–12.4)
Monocytes Absolute: 0.4 10*3/uL (ref 0.1–1.0)
Monocytes Relative: 6 % (ref 3–12)
NEUTROS ABS: 4.7 10*3/uL (ref 1.7–7.7)
NEUTROS PCT: 65 % (ref 43–77)
Platelets: 307 10*3/uL (ref 150–400)
RBC: 4.72 MIL/uL (ref 3.87–5.11)
RDW: 14.5 % (ref 11.5–15.5)
WBC: 7.3 10*3/uL (ref 4.0–10.5)

## 2015-04-21 MED ORDER — BUPROPION HCL ER (XL) 300 MG PO TB24: ORAL_TABLET | ORAL | Status: DC

## 2015-04-21 NOTE — Progress Notes (Signed)
Chief Complaint  Patient presents with  . Depression    med check.    Depression/anxiety:  Overall doing very well on Wellbutrin. She has been extremely happy with how effective it has been.  She reports she no longer fixates on things, or cries, less irritable.  She feels so much better since being put back on Wellbutrin in 09/2013.  H/o heavy menses and anemia.  She has Mirena IUD. Last week she had a normal period. She "fell apart" after getting her period, stressful time at [employer], and missed 2 wellbutrin pills.  She feels better today. This was the only time since July that she had these emotional symptoms recur.  She never wants to stop the Welbutrin. She states that the copay card she has for the branded medication expired.  Previously saw Dr. Harrington Challenger (GYN), who retired, and now sees someone else at that office.  Last seen in May or June, and also had mammogram there. No notes/results have been received.  +intentional weight loss (11# since 12/2013 per our records, more like 21# per pt).  PMH, PSH, SH, and FH reviewed/updated  Outpatient Encounter Prescriptions as of 04/21/2015  Medication Sig Note  . buPROPion (WELLBUTRIN XL) 300 MG 24 hr tablet TAKE 1 TABLET (300 MG TOTAL) BY MOUTH DAILY.   . Magnesium 500 MG CAPS Take 1,000 mg by mouth at bedtime.   . Multiple Vitamin (MULTIVITAMIN WITH MINERALS) TABS Take 1 tablet by mouth every morning. Reported on 04/07/2015   . Probiotic Product (PROBIOTIC DAILY PO) Take 1 capsule by mouth daily. Reported on 04/07/2015   . vitamin B-12 (CYANOCOBALAMIN) 500 MCG tablet Take 500 mcg by mouth every morning. Reported on 04/07/2015 12/26/2013: Hasn't been taking  . [DISCONTINUED] buPROPion (WELLBUTRIN XL) 300 MG 24 hr tablet TAKE 1 TABLET (300 MG TOTAL) BY MOUTH DAILY.   . [DISCONTINUED] buPROPion (WELLBUTRIN XL) 300 MG 24 hr tablet TAKE 1 TABLET (300 MG TOTAL) BY MOUTH DAILY.   . [DISCONTINUED] ALPRAZolam (XANAX) 0.25 MG tablet Take 1 tablet prior to MRI.   Repeat in 30 minutes if needed (Patient not taking: Reported on 04/07/2015)   . [DISCONTINUED] HYDROcodone-acetaminophen (NORCO/VICODIN) 5-325 MG per tablet Take 1-2 tablets by mouth every 4 (four) hours as needed for moderate pain or severe pain. (Patient not taking: Reported on 04/07/2015) 12/26/2013: Not taking  . [DISCONTINUED] Ibuprofen (ADVIL) 200 MG CAPS Take 600 mg by mouth once as needed (pain.). Reported on 04/07/2015   . [DISCONTINUED] IRON PO Take 2 tablets by mouth daily. Reported on 04/07/2015 12/26/2013: Stopped 10/11 (had been slightly sporadic in use)  . [DISCONTINUED] omeprazole (PRILOSEC) 40 MG capsule 1 tablet 45 min prior to breakfast (Patient not taking: Reported on 04/07/2015) 12/26/2013: Stopped 10/11  . [DISCONTINUED] sulfamethoxazole-trimethoprim (BACTRIM DS) 800-160 MG per tablet Take 1 tablet by mouth 2 (two) times daily. (Patient not taking: Reported on 04/07/2015) 12/26/2013: Has 2 days left   No facility-administered encounter medications on file as of 04/21/2015.   No Known Allergies  ROS: no fever, chills, URI symptoms, headaches, dizziness, chest pain, palpitations, shortness of breath, cough. No nausea, vomiting, heartburn, bowel changes, urinary complaints, joint pains, bleeding, bruising, rash or other complaints. Denies insomnia, depression, anxiety.  PHYSICAL EXAM: BP 102/62 mmHg  Pulse 68  Ht 5\' 6"  (1.676 m)  Wt 186 lb (84.369 kg)  BMI 30.04 kg/m2  LMP 04/14/2015  Well developed, pleasant female, in good spirits HEENT: PERRL, EOMI, conjunctiva and sclera are clear, OP clear Neck: no lymphadenopathy,  thyromegaly or carotid bruit Heart: regular rate and rhythm without murmur Lungs: clear bilaterally Back: no spinal or CVA tenderness Abdomen: soft, nontender, no organomegaly or mass Extremities: no edema, normal pulses Neuro: alert and oriented. Cranial nerves intact. Normal strength, gait Psych: normal mood, affect, hygiene and grooming, normal eye contact  and speech.  ASSESSMENT/PLAN:  Anemia, iron deficiency - previously due to heavy menses, which resolved with Mirena IUD - Plan: CBC with Differential/Platelet  Depression, major, in remission ([place]) - doing very well on wellbutrin - Plan: buPROPion (WELLBUTRIN XL) 300 MG 24 hr tablet, DISCONTINUED: buPROPion (WELLBUTRIN XL) 300 MG 24 hr tablet   CBC to f/u on IDA, since she reports that GYN didn't do bloodwork at last appointment in the Spring.   Declines flu shot today, as she is leaving for a vacation later this week. She reports she will return or get one through work when she returns.

## 2015-04-21 NOTE — Patient Instructions (Signed)
Continue your current medications. We are re-checking blood counts to ensure that you are not anemic (if you are, we will contact you and let you know how much iron to take).  When you get a chance, have your GYN send [place] records.  Return for your flu shot (or get at work) when you return from your trip.  Try and get it in the Fall in the future.

## 2015-05-16 ENCOUNTER — Other Ambulatory Visit: Payer: Self-pay | Admitting: Family Medicine

## 2015-05-16 NOTE — Telephone Encounter (Signed)
Is this ok to refill?  

## 2015-05-16 NOTE — Telephone Encounter (Signed)
1 year supply was sent to [facility] last month. It is possible pt called in for refill using old rx number from last bottle, rather than using the new rx that was sent last month.  There should be rx for a year there

## 2015-11-28 NOTE — Progress Notes (Signed)
Chief Complaint  Patient presents with  . Annual Exam    fasting annual exam with pap (used to see GYN but would to do here if possible). Has abnormal pap 1 year ago. No concerns.   . Flu Vaccine    declined.     Priscilla Bautista is a 50 y.o. female who presents for a complete physical.  She has the following concerns:  She has a history of heavy menses, and iron deficiency anemia. She reports she has an IUD, continues to have monthly heavy menses. She does not recall when the IUD was placed (states it was through [employer]); she later changed back to [facility] (whom she had seen regularly for her childbirths). She hadhigh risk HPV on pap in 08/2013--see below.  Due for repeat pap, doesn't plan on going back to GYN unless necessary.  Depression/anxiety:  Overall doing very well on Wellbutrin. She has been extremely happy with how effective it has been.  Prior to taking this she fixated on things, irritable, crying.  She feels so much better since being put back on Wellbutrin in 09/2013. No interest in stopping this medication. She denies any side effects   Immunization History  Administered Date(s) Administered  . Tdap 10/04/2013   Last Pap smear: 08/2013 showed high risk HPV.  She had follow-ups twice after (first at 6 months), and reports they were both normal. No records available. Will request.--got later during visit. 03/2014 normal, 09/2014 normal, with no high risk HPV detected Last mammogram:  approx August 2016, due now. Last colonoscopy: never Last DEXA: never Dentist: twice yearly Ophtho: wears glasses; every 2 years, due now Exercise:  Weekends, walks for an hour, some yoga.  Recently got 2 dogs, so walking more. Diet: Doesn't eat dairy, eats lots of greens.  Only very sporadic with calcium supplements.  Takes MVI daily. Lipid screen: Lab Results  Component Value Date   CHOL 129 10/04/2013   HDL 62 10/04/2013   LDLCALC 52 10/04/2013   TRIG 74 10/04/2013   CHOLHDL 2.1 10/04/2013   Vitamin D screen normal in 09/2013 (level of 42).  Past Medical History:  Diagnosis Date  . Depression 09/2013  . Iron deficiency anemia 09/2013  . Postpartum depression 2000   took Wellbutrin    Past Surgical History:  Procedure Laterality Date  . ECTOPIC PREGNANCY SURGERY      Social History   Social History  . Marital status: Married    Spouse name: Laurey Arrow  . Number of children: 2  . Years of education: N/A   Occupational History  . 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 Smoking status: Former Smoker    Packs/day: 0.30    Years: 26.00    Types: Cigarettes    Quit date: 12/16/2013  . Smokeless tobacco: Never Used  . Alcohol use Yes     Comment: 1 glass of wine per night, maybe 5 glasses per week  . Drug use: No  . Sexual activity: Yes    Partners: Male    Birth control/ protection: IUD   Other Topics Concern  . Not on file   Social History Narrative   Married, 2 daughters.  2 dogs (yorkie-poo)    Family History  Problem Relation Age of Onset  . Hypertension Mother   . Cancer Father     multiple myeloma  . Depression Sister     bipolar possibly  . Cancer Maternal Grandmother  leukemia  . Cancer Maternal Grandfather     lung  . Stroke Paternal Grandfather   . Diabetes Neg Hx   . Heart disease Neg Hx     Outpatient Encounter Prescriptions as of 12/01/2015  Medication Sig Note  . buPROPion (WELLBUTRIN XL) 300 MG 24 hr tablet TAKE 1 TABLET (300 MG TOTAL) BY MOUTH DAILY.   . Magnesium 500 MG CAPS Take 1,000 mg by mouth at bedtime.   . Multiple Vitamin (MULTIVITAMIN WITH MINERALS) TABS Take 1 tablet by mouth every morning. Reported on 04/07/2015   . Probiotic Product (PROBIOTIC DAILY PO) Take 1 capsule by mouth daily. Reported on 04/07/2015   . Pyridoxine HCl (VITAMIN B-6) 500 MG tablet Take 500 mg by mouth daily.   . vitamin B-12 (CYANOCOBALAMIN) 500 MCG tablet Take 500 mcg by mouth every  morning. Reported on 04/07/2015 12/26/2013: Hasn't been taking   No facility-administered encounter medications on file as of 12/01/2015.     No Known Allergies   ROS:  The patient denies anorexia, fever, headaches,  vision changes, decreased hearing, ear pain, sore throat, breast concerns, chest pain, palpitations, dizziness, syncope, dyspnea on exertion, cough, swelling, nausea, vomiting, diarrhea, constipation, abdominal pain, melena, hematochezia, indigestion/heartburn, hematuria, incontinence, dysuria, irregular menstrual cycles (cycles remain heavy), odor or itch, genital lesions, joint pains, numbness, tingling, weakness, tremor, suspicious skin lesions, depression, abnormal bleeding/bruising, or enlarged lymph nodes.   PHYSICAL EXAM:  BP 128/72 (BP Location: Left Arm, Patient Position: Sitting, Cuff Size: Normal)   Pulse 68   Ht 5' 5.5" (1.664 m)   Wt 190 lb (86.2 kg)   BMI 31.14 kg/m   LMP end of August   General Appearance:    Alert, cooperative, no distress, appears stated age  Head:    Normocephalic, without obvious abnormality, atraumatic  Eyes:    PERRL, conjunctiva/corneas clear, EOM's intact, fundi    benign  Ears:    Normal TM's and external ear canals  Nose:   Nares normal, mucosa is normal.  No sinus tenderness or purulent drainage  Throat:   Lips, mucosa, and tongue normal; teeth and gums normal.   Neck:   Supple, no lymphadenopathy;  thyroid:  no   enlargement/tenderness/nodules; no carotid   bruit or JVD  Back:    Spine nontender, no curvature, ROM normal, no CVA     tenderness  Lungs:     Clear to auscultation bilaterally without wheezes, rales or ronchi; respirations unlabored  Chest Wall:    No tenderness or deformity   Heart:    Regular rate and rhythm, S1 and S2 normal, no murmur, rub or gallop  Breast Exam:    normal appearance--no skin dimpling, nipple inversion, nipple discharge, palpable masses or axillary lymphadenopathy  Abdomen:     Soft,  non-tender, nondistended, normoactive bowel sounds,    no masses, no hepatosplenomegaly  Genitalia:    normal external genitalia without lesions.  BUS and vagina normal; no abnormal discharge.  Cervix is normal. IUD strings visualized.  No cervical lesions or cervical motions tenderness.  Uterus and adnexa are normal, not enlarged, nontender, no mass. Pap obtained     Extremities:   No clubbing, cyanosis or edema  Pulses:   2+ and symmetric all extremities  Skin:   Skin color, texture, turgor normal, no rashes or lesions.  Tan skin  Lymph nodes:   Cervical, supraclavicular, and axillary nodes normal  Neurologic:   CNII-XII intact, normal strength, sensation and gait; reflexes 2+ and symmetric throughout  Psych:   Normal mood, affect, hygiene and grooming.   Normal eye contact, speech   ASSESSMENT/PLAN:  Annual physical exam - Plan: POCT Urinalysis Dipstick, Visual acuity screening, Glucose, random, Cytology - PAP [place]  Depression, major, in remission ([place]) - continue wellbutrin  Anemia, iron deficiency - heavy menses; due for recheck.   - Plan: CBC with Differential/Platelet  BMI 30.0-30.9,adult - encouraged weight loss - Plan: Glucose, random  Need for prophylactic vaccination and inoculation against influenza - Plan: Flu Vaccine QUAD 36+ mos PF IM (Fluarix & Fluzone Quad PF)  History of abnormal cervical Pap smear - per pt, had +HPV in 08/2013; subsequent paps normal.  pap sent today   Flu shot CBC, glu  Discussed monthly self breast exams and yearly mammograms (to schedule at [facility] soon); at least 30 minutes of aerobic activity at least 5 days/week, weight-bearing exercises 2x/week; proper sunscreen use reviewed; healthy diet, including goals of calcium and vitamin D intake and alcohol recommendations (less than or equal to 1 drink/day) reviewed; regular seatbelt use; changing batteries in smoke detectors.  Immunization recommendations discussed--flu  shot  today. Colonoscopy recommendations reviewed--age 82, sooner if changes in bowels, family history   Mammo due--[facility] since not planning on going back to GYN's office this year.  F/u 1 year, sooner prn

## 2015-12-01 ENCOUNTER — Other Ambulatory Visit (HOSPITAL_COMMUNITY)
Admission: RE | Admit: 2015-12-01 | Discharge: 2015-12-01 | Disposition: A | Discharge status: Home / Self Care | Payer: Managed Care, Other (non HMO) | Source: Ambulatory Visit | Attending: Family Medicine | Admitting: Family Medicine

## 2015-12-01 ENCOUNTER — Encounter: Payer: Self-pay | Admitting: Family Medicine

## 2015-12-01 ENCOUNTER — Ambulatory Visit (INDEPENDENT_AMBULATORY_CARE_PROVIDER_SITE_OTHER): Payer: Managed Care, Other (non HMO) | Admitting: Family Medicine

## 2015-12-01 ENCOUNTER — Telehealth: Payer: Self-pay

## 2015-12-01 VITALS — BP 128/72 | HR 68 | Ht 65.5 in | Wt 190.0 lb

## 2015-12-01 DIAGNOSIS — Z6830 Body mass index (BMI) 30.0-30.9, adult: Secondary | ICD-10-CM

## 2015-12-01 DIAGNOSIS — Z23 Encounter for immunization: Secondary | ICD-10-CM | POA: Diagnosis not present

## 2015-12-01 DIAGNOSIS — Z0000 Encounter for general adult medical examination without abnormal findings: Secondary | ICD-10-CM

## 2015-12-01 DIAGNOSIS — Z01419 Encounter for gynecological examination (general) (routine) without abnormal findings: Secondary | ICD-10-CM | POA: Insufficient documentation

## 2015-12-01 DIAGNOSIS — Z1151 Encounter for screening for human papillomavirus (HPV): Secondary | ICD-10-CM | POA: Diagnosis not present

## 2015-12-01 DIAGNOSIS — D509 Iron deficiency anemia, unspecified: Secondary | ICD-10-CM | POA: Diagnosis not present

## 2015-12-01 DIAGNOSIS — F325 Major depressive disorder, single episode, in full remission: Secondary | ICD-10-CM

## 2015-12-01 DIAGNOSIS — Z8742 Personal history of other diseases of the female genital tract: Secondary | ICD-10-CM

## 2015-12-01 LAB — CBC WITH DIFFERENTIAL/PLATELET
Basophils Absolute: 44 cells/uL (ref 0–200)
Basophils Relative: 1 %
EOS PCT: 3 %
Eosinophils Absolute: 132 cells/uL (ref 15–500)
HCT: 34.3 % — ABNORMAL LOW (ref 35.0–45.0)
HEMOGLOBIN: 10.3 g/dL — AB (ref 11.7–15.5)
LYMPHS ABS: 1364 {cells}/uL (ref 850–3900)
Lymphocytes Relative: 31 %
MCH: 21.5 pg — ABNORMAL LOW (ref 27.0–33.0)
MCHC: 30 g/dL — ABNORMAL LOW (ref 32.0–36.0)
MCV: 71.5 fL — AB (ref 80.0–100.0)
MPV: 9.9 fL (ref 7.5–12.5)
Monocytes Absolute: 264 cells/uL (ref 200–950)
Monocytes Relative: 6 %
NEUTROS ABS: 2596 {cells}/uL (ref 1500–7800)
Neutrophils Relative %: 59 %
Platelets: 354 10*3/uL (ref 140–400)
RBC: 4.8 MIL/uL (ref 3.80–5.10)
RDW: 16.4 % — ABNORMAL HIGH (ref 11.0–15.0)
WBC: 4.4 10*3/uL (ref 4.0–10.5)

## 2015-12-01 LAB — POCT URINALYSIS DIPSTICK
BILIRUBIN UA: NEGATIVE
Blood, UA: NEGATIVE
GLUCOSE UA: NEGATIVE
Ketones, UA: NEGATIVE
LEUKOCYTES UA: NEGATIVE
Nitrite, UA: NEGATIVE
Protein, UA: NEGATIVE
Spec Grav, UA: >=1.03
Urobilinogen, UA: NEGATIVE
pH, UA: 6

## 2015-12-01 LAB — GLUCOSE, RANDOM: GLUCOSE: 86 mg/dL (ref 65–99)

## 2015-12-01 NOTE — Patient Instructions (Signed)
  HEALTH MAINTENANCE RECOMMENDATIONS:  It is recommended that you get at least 30 minutes of aerobic exercise at least 5 days/week (for weight loss, you may need as much as 60-90 minutes). This can be any activity that gets your heart rate up. This can be divided in 10-15 minute intervals if needed, but try and build up your endurance at least once a week.  Weight bearing exercise is also recommended twice weekly.  Eat a healthy diet with lots of vegetables, fruits and fiber.  "Colorful" foods have a lot of vitamins (ie green vegetables, tomatoes, red peppers, etc).  Limit sweet tea, regular sodas and alcoholic beverages, all of which has a lot of calories and sugar.  Up to 1 alcoholic drink daily may be beneficial for women (unless trying to lose weight, watch sugars).  Drink a lot of water.  Calcium recommendations are 1200-1500 mg daily (1500 mg for postmenopausal women or women without ovaries), and vitamin D 1000 IU daily.  This should be obtained from diet and/or supplements (vitamins), and calcium should not be taken all at once, but in divided doses.  Monthly self breast exams and yearly mammograms for women over the age of 57 is recommended.  Sunscreen of at least SPF 30 should be used on all sun-exposed parts of the skin when outside between the hours of 10 am and 4 pm (not just when at beach or pool, but even with exercise, golf, tennis, and yard work!)  Use a sunscreen that says "broad spectrum" so it covers both UVA and UVB rays, and make sure to reapply every 1-2 hours.  Remember to change the batteries in your smoke detectors when changing your clock times in the spring and fall.  Use your seat belt every time you are in a car, and please drive safely and not be distracted with cell phones and texting while driving.   Set up your mammogram at the [facility]

## 2015-12-01 NOTE — Telephone Encounter (Signed)
Trying to figure out when her IUD was placed. Please double check to see where she could have gone for this, if not [facility] like she said at her visit.

## 2015-12-01 NOTE — Telephone Encounter (Signed)
Pt has not been a pt at [facility], awaiting records from [place]. Priscilla Bautista

## 2015-12-02 LAB — CYTOLOGY - PAP

## 2015-12-02 NOTE — Telephone Encounter (Signed)
Spoke with patient and she will call me back once she gets home and looks through her records.

## 2015-12-08 ENCOUNTER — Encounter: Payer: Self-pay | Admitting: Family Medicine

## 2015-12-10 ENCOUNTER — Other Ambulatory Visit: Payer: Self-pay | Admitting: *Deleted

## 2015-12-10 DIAGNOSIS — D509 Iron deficiency anemia, unspecified: Secondary | ICD-10-CM

## 2015-12-22 ENCOUNTER — Encounter: Payer: Self-pay | Admitting: Family Medicine

## 2015-12-24 ENCOUNTER — Telehealth: Payer: Self-pay

## 2015-12-24 ENCOUNTER — Encounter: Payer: Self-pay | Admitting: *Deleted

## 2015-12-24 NOTE — Telephone Encounter (Signed)
Records rcvd from [place].IUD placement 07/24/2014 ?  per [facility].   Thanks, Wells Guiles

## 2015-12-24 NOTE — Telephone Encounter (Signed)
No--this wasn't placement of IUD. This was GYN exam from 09/27/14--showed that Paragard IUD strings were in place at time of exam, not Mirena. They were going to be requesting records for insertion date (that IUD is good for 10 years, not 5).

## 2016-02-09 ENCOUNTER — Other Ambulatory Visit: Payer: Self-pay | Admitting: *Deleted

## 2016-02-10 ENCOUNTER — Encounter: Payer: Self-pay | Admitting: Family Medicine

## 2016-02-11 NOTE — Telephone Encounter (Signed)
Priscilla Bautista--I responded to her note.  She doesn't have notifications set up for when we message her, so you might want to give her a call and let her know what I said, and to have her check her settings. I'm pretty sure I've replied to things before that were never read, likely because she didn't get notification.

## 2016-03-10 ENCOUNTER — Other Ambulatory Visit: Payer: Self-pay

## 2016-03-10 IMAGING — CT CT RENAL STONE PROTOCOL
1 series · 15 of 23 positions shown, 19 images · non-contrast
Comparison: None.

CLINICAL DATA: Right flank pain radiating to right groin. Worsening
over 4 weeks. Nausea.

EXAM:
CT ABDOMEN AND PELVIS WITHOUT CONTRAST
TECHNIQUE: Multidetector CT imaging of the abdomen and pelvis was performed
following the standard protocol without IV contrast.

[Series 4: lung · axial · 0.74mm/px · z∈[-112,-12]mm · 15 of 23 slices shown, 19 images]
[im 2/23  soft-tissue]
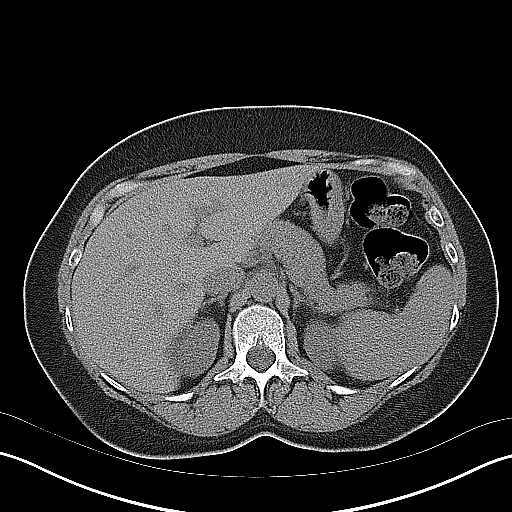
[im 2/23  bone]
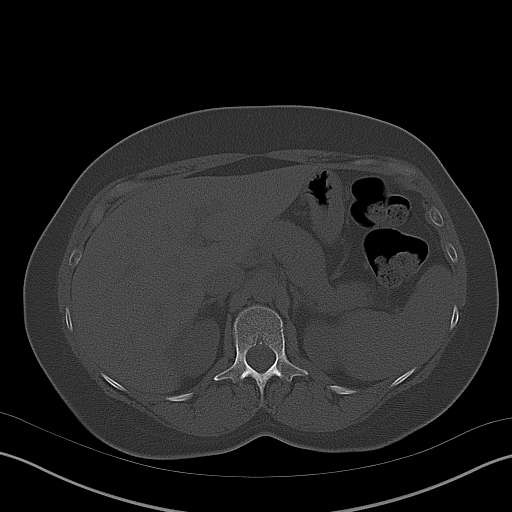
[im 4/23  soft-tissue]
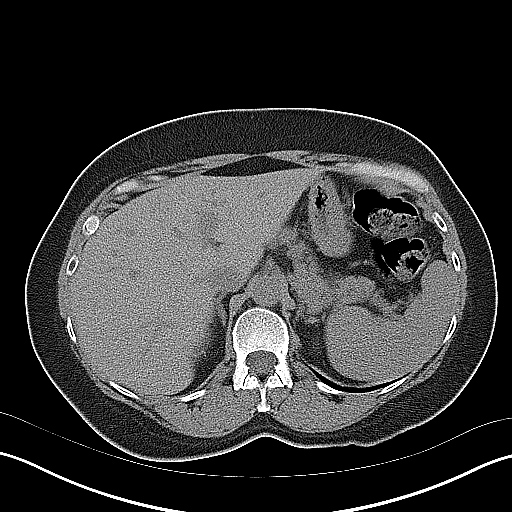
[im 6/23  soft-tissue]
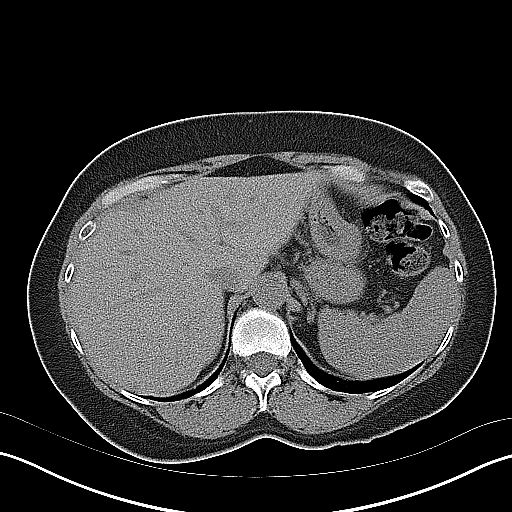
[im 7/23  soft-tissue]
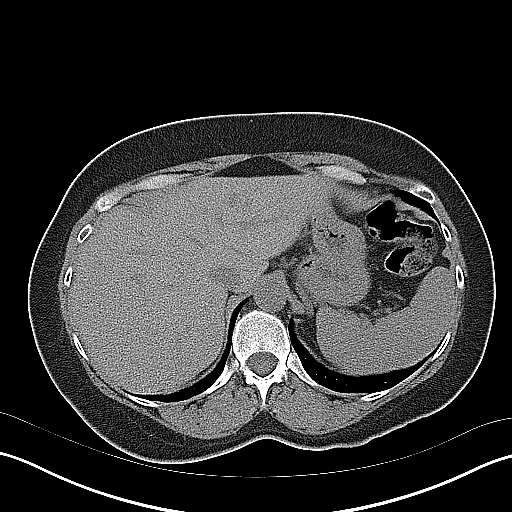
[im 9/23  soft-tissue]
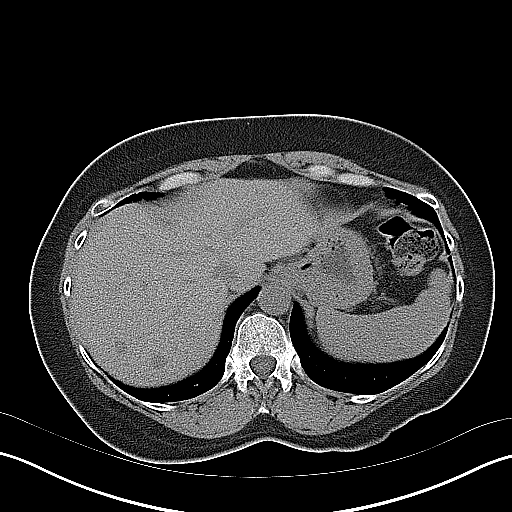
[im 10/23  soft-tissue]
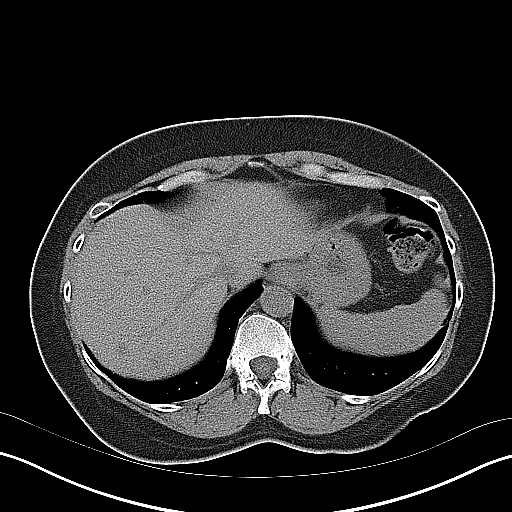
[im 12/23  soft-tissue]
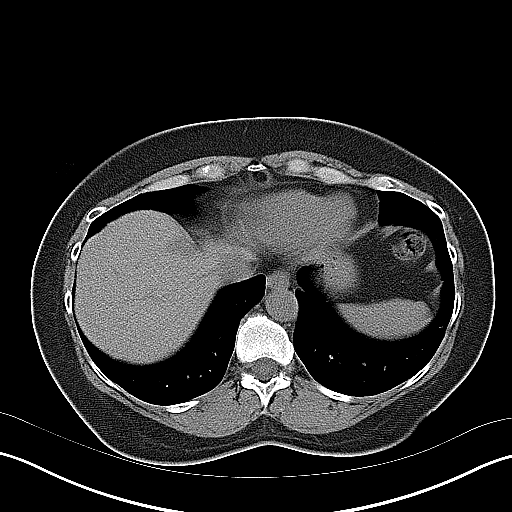
[im 14/23  soft-tissue]
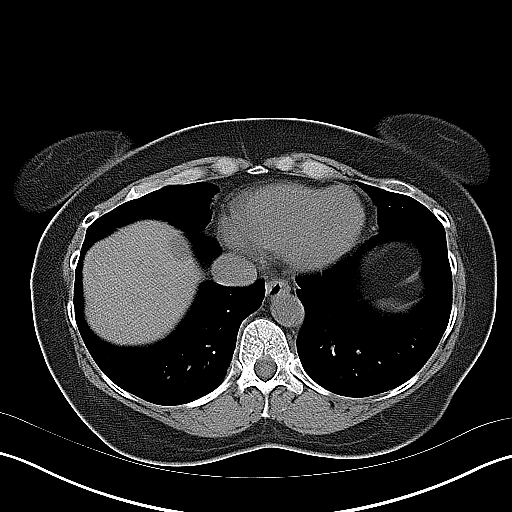
[im 15/23  soft-tissue]
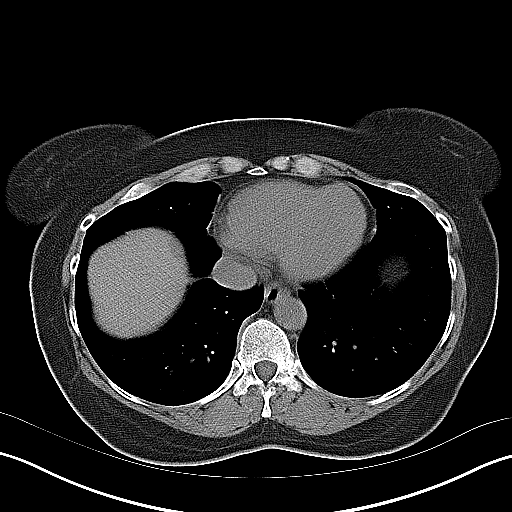
[im 15/23  bone]
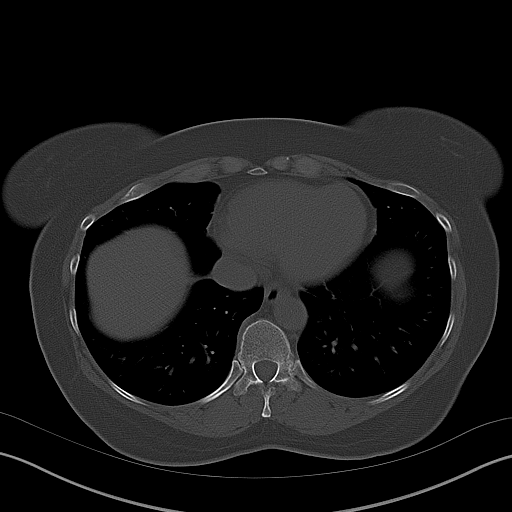
[im 17/23  soft-tissue]
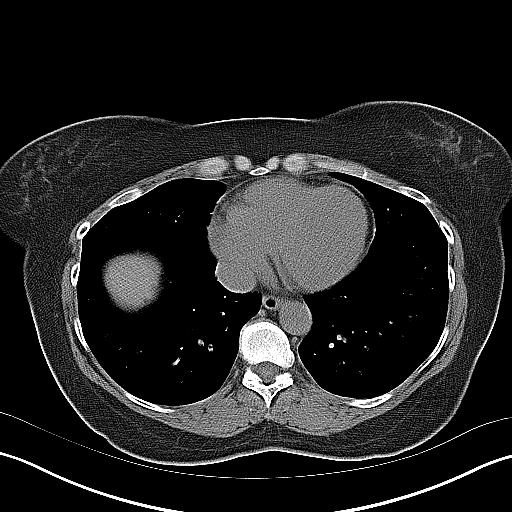
[im 18/23  soft-tissue]
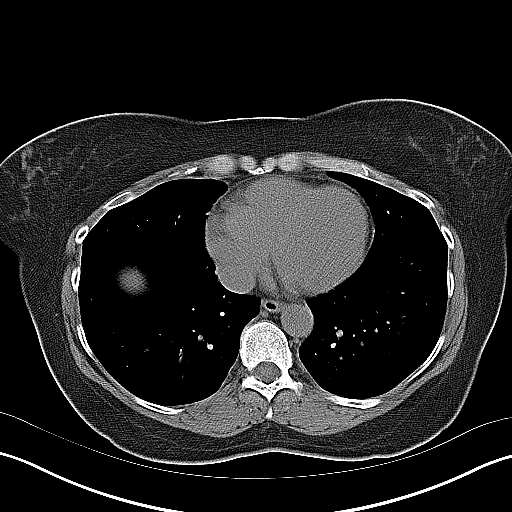
[im 19/23  lung]
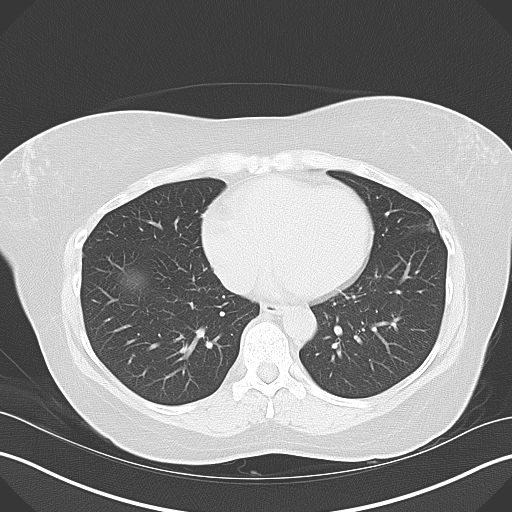
[im 20/23  soft-tissue]
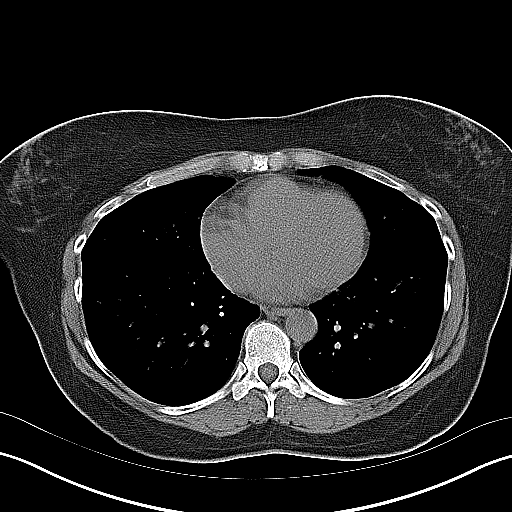
[im 20/23  lung]
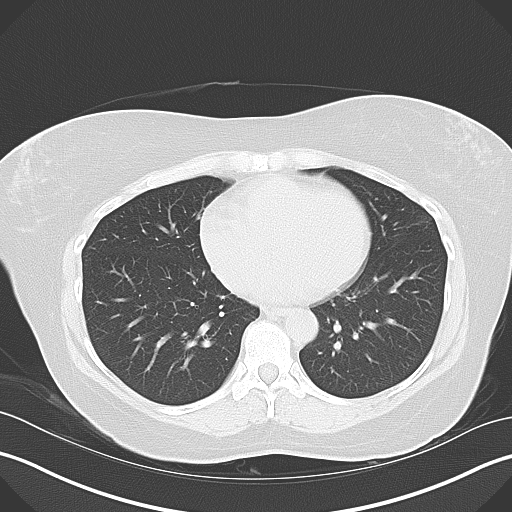
[im 21/23  lung]
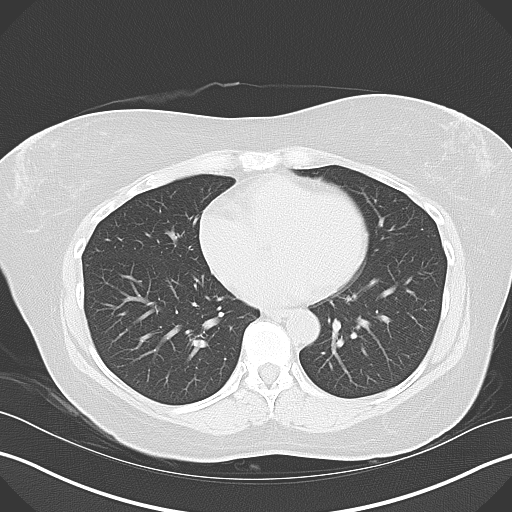
[im 22/23  soft-tissue]
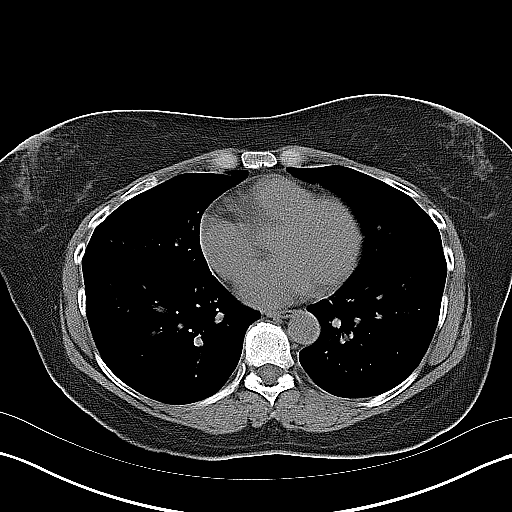
[im 22/23  lung]
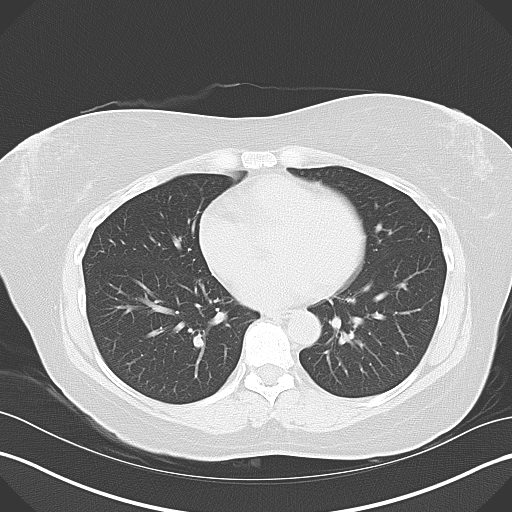

[15 of 23 positions shown; findings below may reference images not displayed]

FINDINGS: Lower chest:  Unremarkable.

Hepatobiliary: Several low-attenuation liver masses are seen in the
right hepatic lobe, largest measuring 2.1 cm at the anterior liver
dome on image 10. These cannot be characterized without IV contrast.
Gallbladder is unremarkable.

Pancreas: No mass or inflammatory process visualized on this
non-contrast exam.

Spleen:  Within normal limits in size.

Adrenal Glands:  No mass identified.

Kidneys/Urinary tract: No evidence of urolithiasis or
hydronephrosis.

Stomach/Bowel/Peritoneum: Unremarkable. Normal appendix visualized.

Vascular/Lymphatic: No pathologically enlarged lymph nodes
identified. No other significant abnormality identified.

Reproductive: IUD noted in uterus. Adnexal regions are unremarkable.

Other:  None.

Musculoskeletal:  No suspicious bone lesions identified.
IMPRESSION: No evidence of urolithiasis, hydronephrosis, or other acute
findings.

Several indeterminate low attenuation liver masses, largest
measuring approximately 2 cm. Recommend further imaging
characterization with abdomen MRI without and with contrast as the
preferred exam. This can be performed non emergently as an
outpatient.

## 2016-03-16 ENCOUNTER — Other Ambulatory Visit: Payer: Self-pay

## 2016-04-26 ENCOUNTER — Other Ambulatory Visit: Payer: Self-pay | Admitting: Family Medicine

## 2016-04-26 DIAGNOSIS — F325 Major depressive disorder, single episode, in full remission: Secondary | ICD-10-CM

## 2016-09-24 ENCOUNTER — Other Ambulatory Visit: Payer: Self-pay | Admitting: Family Medicine

## 2016-09-24 DIAGNOSIS — F325 Major depressive disorder, single episode, in full remission: Secondary | ICD-10-CM

## 2016-09-24 NOTE — Telephone Encounter (Signed)
She was given #90 with 1 refill in Feb, which should lat until 8/12--she should still have about 30d left in her current bottle.  Her next appointment isn't until September, so regardless she will need a refill.  Okay for #90, due to be filled early August

## 2016-09-24 NOTE — Telephone Encounter (Signed)
Is this okay to refill? Pt has an appt with you in september

## 2016-11-28 ENCOUNTER — Encounter: Payer: Self-pay | Admitting: Family Medicine

## 2016-11-29 ENCOUNTER — Encounter: Payer: Self-pay | Admitting: Family Medicine

## 2016-12-05 ENCOUNTER — Encounter: Payer: Self-pay | Admitting: Family Medicine

## 2016-12-05 NOTE — Progress Notes (Deleted)
Priscilla Bautista is a 51 y.o. female who presents for a complete physical.  She has the following concerns:  She has a history of heavy menses, and iron deficiency anemia. She was found to be anemic at her physical last year.  It was recommended that she take iron BID x 2 mos, then once daily, then return for 3 month f/u labs (CBC, ferritin).  She did not have these done. Lab Results  Component Value Date   WBC 4.4 12/01/2015   HGB 10.3 (L) 12/01/2015   HCT 34.3 (L) 12/01/2015   MCV 71.5 (L) 12/01/2015   PLT 354 12/01/2015   She has Paragard IUD (last GYN confirmed the presence of Paragard strings)--date of insertion not known, not in [facility] records (they did not insert); continues to have monthly heavy menses. She does not recall when the IUD was placed (states it was through [facility])--unable to get records (?UPDATE)  H/o high risk HPV on pap in 08/2013.  Follow-up pap smears through the GYN were normal in 03/2014 and 09/2014, with no high risk HPV detected.  Pap smear last year (done here) was also normal.   Depression/anxiety: Overall doing very well on Wellbutrin. She has been extremely happy with how effective it has been. Prior to taking this she fixated on things, irritable, crying. She feels so much better since being put back on Wellbutrin in 09/2013. No interest in stopping this medication. She denies any side effects.  Some stressors include daughter who is a Museum/gallery exhibitions officer at [employer] with some anxiety; daughter in [place] with Lost her father earlier this year. Moving     Immunization History  Administered Date(s) Administered  . Influenza,inj,Quad PF,6+ Mos 12/01/2015  . Tdap 10/04/2013   Last Pap smear: 11/2015, normal, with no high risk HPV Last mammogram: 09/27/14 at [facility] Last colonoscopy: never Last DEXA: never Dentist: twice yearly Ophtho: wears glasses; every 2 years, due now Exercise: Weekends, walks for an hour, some yoga.   Diet: Doesn't eat  dairy, eats lots of greens. Only very sporadic with calcium supplements. Takes MVI daily. Lipid screen: Lab Results  Component Value Date   CHOL 129 10/04/2013   HDL 62 10/04/2013   LDLCALC 52 10/04/2013   TRIG 74 10/04/2013   CHOLHDL 2.1 10/04/2013   Vitamin D screen normal in 09/2013 (level of 42).    ROS: The patient denies anorexia, fever, headaches, vision changes, decreased hearing, ear pain, sore throat, breast concerns, chest pain, palpitations, dizziness, syncope, dyspnea on exertion, cough, swelling, nausea, vomiting, diarrhea, constipation, abdominal pain, melena, hematochezia, indigestion/heartburn, hematuria, incontinence, dysuria, irregular menstrual cycles (cycles remain heavy), odor or itch, genital lesions, joint pains, numbness, tingling, weakness, tremor, suspicious skin lesions, depression, abnormal bleeding/bruising, or enlarged lymph nodes.    PHYSICAL EXAM:   Wt Readings from Last 3 Encounters:  12/01/15 190 lb (86.2 kg)  04/21/15 186 lb (84.4 kg)  04/07/15 189 lb (85.7 kg)   General Appearance:  Alert, cooperative, no distress, appears stated age  Head:  Normocephalic, without obvious abnormality, atraumatic  Eyes:  PERRL, conjunctiva/corneas clear, EOM's intact, fundi benign  Ears:  Normal TM's and external ear canals  Nose: Nares normal, mucosa is normal. No sinus tenderness or purulent drainage  Throat: Lips, mucosa, and tongue normal; teeth and gums normal.   Neck: Supple, no lymphadenopathy; thyroid: no enlargement/tenderness/ nodules; no carotid bruit or JVD  Back:  Spine nontender, no curvature, ROM normal, no CVAtenderness  Lungs:  Clear to auscultation  bilaterally without wheezes, rales orronchi; respirations unlabored  Chest Wall:  No tenderness or deformity  Heart:  Regular rate and rhythm, S1 and S2 normal, no murmur, rub or gallop  Breast Exam:  normal appearance--no skin dimpling, nipple inversion, nipple  discharge, palpable masses or axillary lymphadenopathy  Abdomen:  Soft, non-tender, nondistended, normoactive bowel sounds, no masses, no hepatosplenomegaly  Genitalia:  normal external genitalia without lesions.  BUS and vagina normal; no abnormal discharge.  Cervix is normal. IUD strings visualized.  No cervical lesions or cervical motions tenderness.  Uterus and adnexa are normal, not enlarged, nontender, no mass. Pap obtained  Rectal:   Extremities: No clubbing, cyanosis or edema  Pulses: 2+ and symmetric all extremities  Skin: Skin color, texture, turgor normal, no rashes or lesions.  Tan skin  Lymph nodes: Cervical, supraclavicular, and axillary nodes normal  Neurologic: CNII-XII intact, normal strength, sensation and gait; reflexes 2+ and symmetric throughout  Psych: Normal mood, affect, hygiene and grooming.Normal eye contact, speech   Enter rectal exam if done   ASSESSMENT/PLAN:  Pap Flu shot Shingrix age 13  R/f wellbutrin   Discussed monthly self breast exams and yearly mammograms (past due); at least 30 minutes of aerobic activity at least 5 days/week, weight-bearing exercises 2x/week; proper sunscreen use reviewed; healthy diet, including goals of calcium and vitamin D intake and alcohol recommendations (less than or equal to 1 drink/day) reviewed; regular seatbelt use; changing batteries in smoke detectors. Immunization recommendations discussed--flu shot today; Shingrix recommended age 76. Colonoscopy recommendations reviewed--age 54, sooner if changes in bowels, family history

## 2016-12-06 ENCOUNTER — Encounter: Payer: Managed Care, Other (non HMO) | Admitting: Family Medicine

## 2016-12-06 ENCOUNTER — Telehealth: Payer: Self-pay | Admitting: *Deleted

## 2016-12-06 DIAGNOSIS — Z0000 Encounter for general adult medical examination without abnormal findings: Secondary | ICD-10-CM

## 2016-12-06 NOTE — Telephone Encounter (Signed)

## 2016-12-06 NOTE — Telephone Encounter (Signed)
Needs to be charged $50 no show fee and be rescheduled.  She will need a med check scheduled if CPE can't be scheduled in a timely manner (from a cancelation), as she will be due for med refill in October

## 2016-12-08 ENCOUNTER — Encounter: Payer: Self-pay | Admitting: Family Medicine

## 2016-12-08 NOTE — Telephone Encounter (Signed)
Letter with appt request and fee sent

## 2016-12-14 NOTE — Progress Notes (Signed)
Chief Complaint  Patient presents with  . Annual Exam    fasting annual exam with pap. No concerns.     Priscilla Bautista is a 51 y.o. female who presents for a complete physical.   She got a promotion, changing jobs and will be moving to [place], [place]. She starts on 12/22/16. House goes on the market in the next week or two.  She has a history of heavy menses, and iron deficiency anemia. She was found to be anemic at her physical last year.  It was recommended that she take iron BID x 2 mos, then once daily, then return for 3 month f/u labs (CBC, ferritin).  She did not have these done, but she reports she took the iron as directed.  Her periods have become less frequent, about every 2-3 months.  They are still heavy, but not nearly as heavy as in the past. Denies any hot flashes or night sweats. She had some breast tenderness a few weeks ago, followed by just some light spotting. She has noted some breast tenderness today as well. No change in caffeine intake, limited.  Lab Results  Component Value Date   WBC 4.4 12/01/2015   HGB 10.3 (L) 12/01/2015   HCT 34.3 (L) 12/01/2015   MCV 71.5 (L) 12/01/2015   PLT 354 12/01/2015   She has Paragard IUD (last GYN confirmed the presence of Paragard strings)--date of insertion not known, not in [facility] records (they did not insert). She does not recall when the IUD was placed (states it was through [facility], was unable to get records, thinks that was not the correct practice).  Veronica and patient both tried to get information (unsure of which practice). She doesn't want it removed today, and doesn't want another IUD (painful to have put in).   H/o high risk HPV on pap in 08/2013.  Follow-up pap smears through the GYN were normal in 03/2014 and 09/2014, with no high risk HPV detected. Pap smear last year (done here) was also normal. She reports her husband was treated for HPV (warts).  Depression/anxiety: Overall doing very well on  Wellbutrin. She has been extremely happy with how effective it has been. Per prior notes, prior to taking Wellbutrin she fixatedon things, irritable, crying.She feels so much better since being put back on Wellbutrin in 09/2013. No interest in stopping this medication. She denies any side effects.  Recently has been having more trouble sleeping.  Falls asleep okay, wakes up 20 minutes later, whole body needs to move, feels achey, "uneasy", takes advil and goes back to bed. Not every night. Some stressors include: moving, new job, Copywriter, advertising,  daughter who is a Museum/gallery exhibitions officer at [employer] with some anxiety; daughter in [place] with some issues, sister with anxiety. Lost her father earlier this year.   Immunization History  Administered Date(s) Administered  . Influenza,inj,Quad PF,6+ Mos 12/01/2015  . Tdap 10/04/2013   Last Pap smear: 11/2015, normal, with no high risk HPV Last mammogram: 09/27/14 at [facility] Last colonoscopy: never Last DEXA: never Dentist: twice yearly (today) Ophtho: wears glasses overdue (plans to schedule in [facility] [place] [facility]) Exercise: walks 2-3 times/week, for an hour; some yoga, 1-2x/week.  Diet: Back to eating dairy; eats lots of greens. Only very sporadic with calcium supplements. Takes MVI daily. Lipid screen: Lab Results  Component Value Date   CHOL 129 10/04/2013   HDL 62 10/04/2013   LDLCALC 52 10/04/2013   TRIG 74 10/04/2013   CHOLHDL 2.1 10/04/2013  Vitamin D screen normal in 09/2013 (level of 42).  Outpatient Encounter Prescriptions as of 12/15/2016  Medication Sig Note  . buPROPion (WELLBUTRIN XL) 300 MG 24 hr tablet TAKE 1 TABLET BY MOUTH DAILY.   . Magnesium 500 MG CAPS Take 1,000 mg by mouth at bedtime.   . Multiple Vitamin (MULTIVITAMIN WITH MINERALS) TABS Take 1 tablet by mouth every morning. Reported on 04/07/2015   . Probiotic Product (PROBIOTIC DAILY PO) Take 1 capsule by mouth daily. Reported on 04/07/2015   . Pyridoxine HCl (VITAMIN B-6) 500 MG  tablet Take 500 mg by mouth daily.   . vitamin B-12 (CYANOCOBALAMIN) 500 MCG tablet Take 500 mcg by mouth every morning. Reported on 04/07/2015 12/15/2016: Takes a super-B pill that has both the B6 and B12 together  . [DISCONTINUED] ALPRAZolam (XANAX) 0.25 MG tablet alprazolam 0.25 mg tablet    No facility-administered encounter medications on file as of 12/15/2016.     ROS: The patient denies anorexia, fever, headaches, vision changes, decreased hearing, ear pain, sore throat, breast concerns, chest pain, palpitations, dizziness, syncope, dyspnea on exertion, cough, swelling, nausea, vomiting, diarrhea, constipation, abdominal pain, melena, hematochezia, indigestion/heartburn, hematuria, incontinence, dysuria, vaginal discharge, odor or itch, genital lesions, joint pains, numbness, tingling, weakness, tremor, suspicious skin lesions, depression, abnormal bleeding/bruising, or enlarged lymph nodes. Periods have been less frequent (every 2-3 mos), less heavy. No hot flashes. Some recent trouble sleeping, as per HPI.     PHYSICAL EXAM:  BP 120/70 (BP Location: Left Arm, Patient Position: Sitting, Cuff Size: Normal)   Pulse 72   Ht 5' 5.5" (1.664 m)   Wt 194 lb (88 kg)   BMI 31.79 kg/m   Wt Readings from Last 3 Encounters:  12/15/16 194 lb (88 kg)  12/01/15 190 lb (86.2 kg)  04/21/15 186 lb (84.4 kg)    General Appearance:  Alert, cooperative, no distress, appears stated age  Head:  Normocephalic, without obvious abnormality, atraumatic  Eyes:  PERRL, conjunctiva/corneas clear, EOM's intact, fundi benign  Ears:  Normal TM's and external ear canals  Nose: Nares normal, mucosa is mildly edematous with clear mucus. No sinus tenderness  Throat: Lips, mucosa, and tongue normal; teeth and gums normal.   Neck: Supple, no lymphadenopathy; thyroid: no enlargement/tenderness/ nodules; no carotid bruit or JVD  Back:  Spine nontender, no curvature, ROM normal, no  CVAtenderness  Lungs:  Clear to auscultation bilaterally without wheezes, rales orronchi; respirations unlabored  Chest Wall:  No tenderness or deformity  Heart:  Regular rate and rhythm, S1 and S2 normal, no murmur, rub or gallop  Breast Exam:  normal appearance--no skin dimpling, nipple inversion, nipple discharge, palpable masses or axillary lymphadenopathy. She is mildly tender diffusely  Abdomen:  Soft, non-tender, nondistended, normoactive bowel sounds, no masses, no hepatosplenomegaly  Genitalia:  normal external genitalia without lesions. BUS and vagina normal; no abnormal discharge. Cervix is normal. IUD strings (white) visualized. No cervical lesions or cervical motions tenderness. Uterus and adnexa are normal, not enlarged, nontender, no mass. Pap obtained  Rectal: Normal sphincter tone, no mass. Heme negative stool  Extremities: No clubbing, cyanosis or edema  Pulses: 2+ and symmetric all extremities  Skin: Skin color, texture, turgor normal, no rashes or lesions.   Lymph nodes: Cervical, supraclavicular, and axillary nodes normal  Neurologic: CNII-XII intact, normal strength, sensation and gait; reflexes 2+ and symmetric throughout  Psych: Normal mood, affect, hygiene, grooming, eye contact, speech   ASSESSMENT/PLAN:  Annual physical exam - Plan: POCT Urinalysis DIP (Proadvantage  Device), Visual acuity screening, Comprehensive metabolic panel, CBC with Differential/Platelet, Ferritin, TSH, Cytology - PAP [place]  Depression, major, in remission ([place]) - Plan: TSH, buPROPion (WELLBUTRIN XL) 300 MG 24 hr tablet  Iron deficiency anemia, unspecified iron deficiency anemia type - asymptomatic; periods are a little lighter and less frequent. Recheck labs - Plan: CBC with Differential/Platelet, Ferritin  Need for influenza vaccination - Plan: Flu Vaccine QUAD 6+ mos PF IM (Fluarix Quad PF)  Weight gain - counseled re: diet,  exercise, portions, risks of obesity - Plan: TSH   CBC, ferritin, c-met ,TSH Pap performed Flu shot given Shingrix age 39--discussed in detail (to get in [place], from pharmacy)  R/f wellbutrin x 1 year, will transfer refills to pharmacy in [facility].  Advised/reminded that we still need to know when IUD was placed, to know when to remove. She recalls where the office was, but not the name of the practice. She will drive by and call. Understands that it should be removed after a year, related to decreased efficacy. It appears to be in normal location.  She does not want another IUD, nor does she want hormonal treatments. Options briefly reviewed.  Ideally, hoping she has a few years left to leave the IUD in place, until menopause.  Discussed monthly self breast exams and yearly mammograms (past due, advised to schedule); at least 30 minutes of aerobic activity at least 5 days/week, weight-bearing exercises 2x/week; proper sunscreen use reviewed; healthy diet, including goals of calcium and vitamin D intake and alcohol recommendations (less than or equal to 1 drink/day) reviewed; regular seatbelt use; changing batteries in smoke detectors. Immunization recommendations discussed--flu shot today; Shingrix recommended age 22. Colonoscopy recommendations reviewed--age 39, sooner if changes in bowels, family history.  She asked about me staying as her PCP while in [place]--advised it would be best for her to find a local PCP for illness; can return here annually for CPE's if she would like and is able.

## 2016-12-15 ENCOUNTER — Encounter: Payer: Self-pay | Admitting: Family Medicine

## 2016-12-15 ENCOUNTER — Other Ambulatory Visit (HOSPITAL_COMMUNITY)
Admission: RE | Admit: 2016-12-15 | Discharge: 2016-12-15 | Disposition: A | Discharge status: Home / Self Care | Payer: Managed Care, Other (non HMO) | Source: Ambulatory Visit | Attending: Family Medicine | Admitting: Family Medicine

## 2016-12-15 ENCOUNTER — Ambulatory Visit (INDEPENDENT_AMBULATORY_CARE_PROVIDER_SITE_OTHER): Payer: Managed Care, Other (non HMO) | Admitting: Family Medicine

## 2016-12-15 VITALS — BP 120/70 | HR 72 | Ht 65.5 in | Wt 194.0 lb

## 2016-12-15 DIAGNOSIS — R635 Abnormal weight gain: Secondary | ICD-10-CM | POA: Diagnosis not present

## 2016-12-15 DIAGNOSIS — F325 Major depressive disorder, single episode, in full remission: Secondary | ICD-10-CM

## 2016-12-15 DIAGNOSIS — D509 Iron deficiency anemia, unspecified: Secondary | ICD-10-CM | POA: Diagnosis not present

## 2016-12-15 DIAGNOSIS — Z23 Encounter for immunization: Secondary | ICD-10-CM | POA: Diagnosis not present

## 2016-12-15 DIAGNOSIS — Z0000 Encounter for general adult medical examination without abnormal findings: Secondary | ICD-10-CM | POA: Diagnosis not present

## 2016-12-15 LAB — POCT URINALYSIS DIP (PROADVANTAGE DEVICE)
Bilirubin, UA: NEGATIVE
Blood, UA: NEGATIVE
GLUCOSE UA: NEGATIVE mg/dL
LEUKOCYTES UA: NEGATIVE
NITRITE UA: NEGATIVE
Protein Ur, POC: NEGATIVE mg/dL
Specific Gravity, Urine: 1.025
Urobilinogen, Ur: NEGATIVE
pH, UA: 6 (ref 5.0–8.0)

## 2016-12-15 MED ORDER — BUPROPION HCL ER (XL) 300 MG PO TB24: 300.0000 mg | ORAL_TABLET | Freq: Every day | ORAL | 3 refills | Status: DC

## 2016-12-15 NOTE — Patient Instructions (Addendum)
  HEALTH MAINTENANCE RECOMMENDATIONS:  It is recommended that you get at least 30 minutes of aerobic exercise at least 5 days/week (for weight loss, you may need as much as 60-90 minutes). This can be any activity that gets your heart rate up. This can be divided in 10-15 minute intervals if needed, but try and build up your endurance at least once a week.  Weight bearing exercise is also recommended twice weekly.  Eat a healthy diet with lots of vegetables, fruits and fiber.  "Colorful" foods have a lot of vitamins (ie green vegetables, tomatoes, red peppers, etc).  Limit sweet tea, regular sodas and alcoholic beverages, all of which has a lot of calories and sugar.  Up to 1 alcoholic drink daily may be beneficial for women (unless trying to lose weight, watch sugars).  Drink a lot of water.  Calcium recommendations are 1200-1500 mg daily (1500 mg for postmenopausal women or women without ovaries), and vitamin D 1000 IU daily.  This should be obtained from diet and/or supplements (vitamins), and calcium should not be taken all at once, but in divided doses.  Monthly self breast exams and yearly mammograms for women over the age of 72 is recommended.  Sunscreen of at least SPF 30 should be used on all sun-exposed parts of the skin when outside between the hours of 10 am and 4 pm (not just when at beach or pool, but even with exercise, golf, tennis, and yard work!)  Use a sunscreen that says "broad spectrum" so it covers both UVA and UVB rays, and make sure to reapply every 1-2 hours.  Remember to change the batteries in your smoke detectors when changing your clock times in the spring and fall.  Use your seat belt every time you are in a car, and please drive safely and not be distracted with cell phones and texting while driving.  Call the [facility]) to schedule a 3D screening mammogram. Consider Down Dog yoga app for home yoga.  Consider taking a tylenol arthritis pill prior  to bed to see if that helps prevent your sleep problems. Versus taking it only if you wake up. This is better to take on an empty stomach than the advil.  I recommend getting the new shingles vaccine (Shingrix) when you are 52. You will need to check with your insurance to see if it is covered, and if covered by Medicare Part D, you need to get from the pharmacy rather than our office.  It is a series of 2 injections, spaced 2 months apart.  Before you move, please try and figure out the name of the GYN practice who put in your IUD. We need to figure out the date it was placed, in order to know when it needs to be removed.

## 2016-12-16 LAB — CBC WITH DIFFERENTIAL/PLATELET
BASOS ABS: 39 {cells}/uL (ref 0–200)
Basophils Relative: 0.7 %
EOS ABS: 118 {cells}/uL (ref 15–500)
Eosinophils Relative: 2.1 %
HEMATOCRIT: 35 % (ref 35.0–45.0)
Hemoglobin: 11 g/dL — ABNORMAL LOW (ref 11.7–15.5)
Lymphs Abs: 1736 cells/uL (ref 850–3900)
MCH: 22.3 pg — ABNORMAL LOW (ref 27.0–33.0)
MCHC: 31.4 g/dL — ABNORMAL LOW (ref 32.0–36.0)
MCV: 70.9 fL — AB (ref 80.0–100.0)
MPV: 10.6 fL (ref 7.5–12.5)
Monocytes Relative: 9.1 %
Neutro Abs: 3198 cells/uL (ref 1500–7800)
Neutrophils Relative %: 57.1 %
Platelets: 341 10*3/uL (ref 140–400)
RBC: 4.94 10*6/uL (ref 3.80–5.10)
RDW: 17.5 % — ABNORMAL HIGH (ref 11.0–15.0)
TOTAL LYMPHOCYTE: 31 %
WBC mixed population: 510 cells/uL (ref 200–950)
WBC: 5.6 10*3/uL (ref 3.8–10.8)

## 2016-12-16 LAB — COMPREHENSIVE METABOLIC PANEL
AG RATIO: 1.9 (calc) (ref 1.0–2.5)
ALKALINE PHOSPHATASE (APISO): 45 U/L (ref 33–115)
ALT: 17 U/L (ref 6–29)
AST: 22 U/L (ref 10–35)
Albumin: 4.3 g/dL (ref 3.6–5.1)
BILIRUBIN TOTAL: 0.4 mg/dL (ref 0.2–1.2)
BUN: 7 mg/dL (ref 7–25)
CHLORIDE: 104 mmol/L (ref 98–110)
CO2: 27 mmol/L (ref 20–32)
Calcium: 9 mg/dL (ref 8.6–10.2)
Creat: 0.7 mg/dL (ref 0.50–1.10)
Globulin: 2.3 g/dL (calc) (ref 1.9–3.7)
Glucose, Bld: 76 mg/dL (ref 65–99)
Potassium: 4.2 mmol/L (ref 3.5–5.3)
SODIUM: 138 mmol/L (ref 135–146)
Total Protein: 6.6 g/dL (ref 6.1–8.1)

## 2016-12-16 LAB — FERRITIN: Ferritin: 5 ng/mL — ABNORMAL LOW (ref 10–232)

## 2016-12-16 LAB — TSH: TSH: 1.79 mIU/L

## 2016-12-17 ENCOUNTER — Encounter: Payer: Self-pay | Admitting: Family Medicine

## 2016-12-20 LAB — CYTOLOGY - PAP
Diagnosis: NEGATIVE
HPV: NOT DETECTED

## 2017-01-15 DIAGNOSIS — J029 Acute pharyngitis, unspecified: Secondary | ICD-10-CM | POA: Diagnosis not present

## 2017-01-21 ENCOUNTER — Telehealth: Payer: Self-pay | Admitting: Family Medicine

## 2017-01-21 DIAGNOSIS — F325 Major depressive disorder, single episode, in full remission: Secondary | ICD-10-CM

## 2017-01-21 MED ORDER — BUPROPION HCL ER (XL) 300 MG PO TB24: 300.0000 mg | ORAL_TABLET | Freq: Every day | ORAL | 0 refills | Status: DC

## 2017-01-21 NOTE — Telephone Encounter (Signed)
Sent in #5 of welbutrin to [facility]

## 2017-01-21 NOTE — Telephone Encounter (Signed)
Pt husband called and states that Priscilla Bautista left her wellbutrin at home in [place], if you could please send her a few to get her threw to the weekend, pt uses [id number] - [place], [place] pt can be reached at [phone] or pete can be reached at [phone] informed pete that  she may have to pay out of pocket for them. I leave at 12.30  If you need to send anything back please refer to [facility] informed them that you was out of the office today

## 2017-01-21 NOTE — Telephone Encounter (Signed)
Okay to call in.  Okay for #5, likely needs fewer if only for the weekend.

## 2017-06-02 DIAGNOSIS — N946 Dysmenorrhea, unspecified: Secondary | ICD-10-CM | POA: Diagnosis not present

## 2017-08-05 ENCOUNTER — Other Ambulatory Visit: Payer: Self-pay | Admitting: Family Medicine

## 2017-08-05 DIAGNOSIS — F325 Major depressive disorder, single episode, in full remission: Secondary | ICD-10-CM

## 2017-08-05 NOTE — Telephone Encounter (Signed)
Per last ov note. Pt moving to [place] for new job

## 2017-10-19 DIAGNOSIS — Z0000 Encounter for general adult medical examination without abnormal findings: Secondary | ICD-10-CM | POA: Diagnosis not present

## 2017-10-19 DIAGNOSIS — Z3041 Encounter for surveillance of contraceptive pills: Secondary | ICD-10-CM | POA: Diagnosis not present

## 2018-01-05 ENCOUNTER — Other Ambulatory Visit: Payer: Self-pay | Admitting: Family Medicine

## 2018-01-05 DIAGNOSIS — F325 Major depressive disorder, single episode, in full remission: Secondary | ICD-10-CM

## 2018-01-05 NOTE — Telephone Encounter (Signed)
Left message for patient to call me back. 

## 2018-01-05 NOTE — Telephone Encounter (Signed)
She scheduled that appt 12/2016, and has since moved to [place]. Don't know if she plans on coming back. We should call her--see if she has established with a doctor in [place].  If she doesn't plan to come for November CPE, we should cancel it, and can give 90d, time for her to find a new doctor in [place].  If she is planning to come, okay to refill #30 only. (if you can't reach her, can refill #30 only)

## 2018-01-05 NOTE — Telephone Encounter (Signed)
Is this ok to refill? She has CPE scheduled for Nov.

## 2018-01-09 NOTE — Telephone Encounter (Signed)
Left another message.

## 2018-01-31 NOTE — Progress Notes (Deleted)
Priscilla Bautista is a 52 y.o. female who presents for a complete physical.  She has the following concerns:  She moved to [place], [place] last year for job promotion.    She has a history of heavy menses, and iron deficiency anemia, noted also at her physical last year. At her physical last year, she reported that her periods had become less frequent, about every 2-3 months.  They are still heavy, but not nearly as heavy as in the past. Denies any hot flashes or night sweats.  Lab Results  Component Value Date   WBC 5.6 12/15/2016   HGB 11.0 (L) 12/15/2016   HCT 35.0 12/15/2016   MCV 70.9 (L) 12/15/2016   PLT 341 12/15/2016   Lab Results  Component Value Date   IRON 11 (L) 10/04/2013   FERRITIN 5 (L) 12/15/2016    She has Paragard IUD (last GYN confirmed the presence of Paragard strings)--date of insertion not known, not in [facility] records (they did not insert). She does not recall when the IUD was placed (states it was through [employer], was unable to get records, thinks that was not the correct practice).  Veronica and patient both tried to get information (unsure of which practice). She declined to have it removed last year.  H/o high risk HPV on pap in 08/2013. Follow-up pap smears through the GYN were normal in 03/2014 and 09/2014, with no high risk HPV detected. Pap smear done here in 2017 was normal; last pap 1 year ago showed shift in vaginal flora (BV), normal pap otherwise, and no high risk HPV. She reports her husband was treated for HPV (warts).  Depression/anxiety: Doing well on Wellbutrin. Per prior notes, prior to taking Wellbutrin she fixatedon things, irritable, crying.She feels so much better since being put back on Wellbutrin in 09/2013. No interest in stopping this medication. She denies any side effects.   Last year: Had been having more trouble sleeping.  Falls asleep okay, wakes up 20 minutes later, whole body needs to move, feels achey, "uneasy",  takes advil and goes back to bed. Not every night. Some stressors include: moving, new job, Copywriter, advertising,  daughter who is a Museum/gallery exhibitions officer at [employer] with some anxiety; daughter in [place] with some issues, sister with anxiety. Lost her father earlier this year.   Immunization History  Administered Date(s) Administered  . Influenza,inj,Quad PF,6+ Mos 12/01/2015, 12/15/2016  . Tdap 10/04/2013   Last Pap smear: 12/2016, normal, with no high risk HPV. Shift in vaginal flora (BV) noted Last mammogram: 09/27/14 at [facility] Last colonoscopy: never Last DEXA: never Dentist: twice yearly Ophtho: wears glasses overdue (plans to schedule in [facility]) Exercise: walks 2-3 times/week, for an hour; some yoga, 1-2x/week.  Diet: Back to eating dairy; eats lots of greens. Only very sporadic with calcium supplements. Takes MVI daily. Lipid screen: Lab Results  Component Value Date   CHOL 129 10/04/2013   HDL 62 10/04/2013   LDLCALC 52 10/04/2013   TRIG 74 10/04/2013   CHOLHDL 2.1 10/04/2013   Vitamin D screen normal in 09/2013 (level of 42).    ROS: The patient denies anorexia, fever, headaches, vision changes, decreased hearing, ear pain, sore throat, breast concerns, chest pain, palpitations, dizziness, syncope, dyspnea on exertion, cough, swelling, nausea, vomiting, diarrhea, constipation, abdominal pain, melena, hematochezia, indigestion/heartburn, hematuria, incontinence, dysuria, vaginal discharge, odor or itch, genital lesions, joint pains, numbness, tingling, weakness, tremor, suspicious skin lesions, depression, abnormal bleeding/bruising, or enlarged lymph nodes. Periods have  been less frequent (every 2-3 mos), less heavy. No hot flashes.  Update periods Vaginal discharge? Insomnia?   PHYSICAL EXAM:  Wt Readings from Last 3 Encounters:  12/15/16 194 lb (88 kg)  12/01/15 190 lb (86.2 kg)  04/21/15 186 lb (84.4 kg)    General Appearance:  Alert, cooperative, no distress, appears  stated age  Head:  Normocephalic, without obvious abnormality, atraumatic  Eyes:  PERRL, conjunctiva/corneas clear, EOM's intact, fundi benign  Ears:  Normal TM's and external ear canals  Nose: Nares normal, mucosa is mildly edematous with clear mucus. No sinus tenderness  Throat: Lips, mucosa, and tongue normal; teeth and gums normal.   Neck: Supple, no lymphadenopathy; thyroid: no enlargement/tenderness/ nodules; no carotid bruit or JVD  Back:  Spine nontender, no curvature, ROM normal, no CVAtenderness  Lungs:  Clear to auscultation bilaterally without wheezes, rales orronchi; respirations unlabored  Chest Wall:  No tenderness or deformity  Heart:  Regular rate and rhythm, S1 and S2 normal, no murmur, rub or gallop  Breast Exam:  normal appearance--no skin dimpling, nipple inversion, nipple discharge, palpable masses or axillary lymphadenopathy. She is mildly tender diffusely  Abdomen:  Soft, non-tender, nondistended, normoactive bowel sounds, no masses, no hepatosplenomegaly  Genitalia:  normal external genitalia without lesions. BUS and vagina normal; no abnormal discharge. Cervix is normal. IUD strings (white) visualized. No cervical lesions or cervical motions tenderness. Uterus and adnexa are normal, not enlarged, nontender, no mass. Pap obtained  Rectal: Normal sphincter tone, no mass. Heme negative stool  Extremities: No clubbing, cyanosis or edema  Pulses: 2+ and symmetric all extremities  Skin: Skin color, texture, turgor normal, no rashes or lesions.   Lymph nodes: Cervical, supraclavicular, and axillary nodes normal  Neurologic: CNII-XII intact, normal strength, sensation and gait; reflexes 2+ and symmetric throughout  Psych: Normal mood, affect, hygiene, grooming, eye contact, speech  Update nose, breast ? Need for pap, check strings, need IUD removed???   ASSESSMENT/PLAN:   Flu shot if not gotten  elsewhere Shingrix recommended mammo done elsewhere?? Needs colonoscopy (in [place]) vs Cologuard  Ever get date of IUD placement???   CBC, ferritin, c-met, TSH FSH Lipids if fasting   Discussed monthly self breast exams and yearly mammograms (past due, advised to schedule); at least 30 minutes of aerobic activity at least 5 days/week, weight-bearing exercises 2x/week; proper sunscreen use reviewed; healthy diet, including goals of calcium and vitamin D intake and alcohol recommendations (less than or equal to 1 drink/day) reviewed; regular seatbelt use; changing batteries in smoke detectors. Immunization recommendations discussed--flu shot today; Shingrix recommended age 80. Colonoscopy recommendations reviewed, due now

## 2018-02-01 ENCOUNTER — Encounter: Payer: Managed Care, Other (non HMO) | Admitting: Family Medicine

## 2018-02-11 ENCOUNTER — Other Ambulatory Visit: Payer: Self-pay | Admitting: Family Medicine

## 2018-02-11 DIAGNOSIS — F325 Major depressive disorder, single episode, in full remission: Secondary | ICD-10-CM

## 2018-03-06 NOTE — Progress Notes (Signed)
Chief Complaint  Patient presents with  . Depression    med check. Will get flu shot here today.    She moved to [place], [place] last year for job promotion.  She presents to follow up on her depression/anxiety.  She loves her new job, but as expected, is stressful. "I definitely need Wellbutrin", can tell if she misses even just one pill. We discussed her stressors--mainly just work. Everything else is good.  Asked about her moods due to the fact that this is the first Christmas since her dad passed, but she reports doing well.  Daughter Ovid Curd Chiropractor at [employer]) is  having some issues with anxiety, attention, moods, but overall reports things at home are good.  Per prior notes, prior to takingWellbutrinshe fixatedon things, irritable, crying.She feels so much better since being put back on Wellbutrin in 09/2013. No interest in stopping this medication. She denies any side effects.  She has not been exercising, other than walking the dogs 30 minutes daily (but frequent stops). She plans to try harder.  She has gained some weight.  She has a history of heavy menses, and iron deficiency anemia Lab Results  Component Value Date   WBC 5.6 12/15/2016   HGB 11.0 (L) 12/15/2016   HCT 35.0 12/15/2016   MCV 70.9 (L) 12/15/2016   PLT 341 12/15/2016   Lab Results  Component Value Date   IRON 11 (L) 10/04/2013   FERRITIN 5 (L) 12/15/2016   She has Paragard IUD (last GYN confirmed the presence of Paragard strings)--date of insertion not known, not in [facility] records (they did not insert).She does not recall when the IUD was placed (states it was through [employer], was unable to get records, thinks that was not the correct practice, and never looked into it further). Veronica and patient both tried to get information (unsure of which practice). She declined to have it removed last year. She is ready to have it removed this year.  She continues to have regular periods, but they are  not as heavy as in the past.  She denies hot flashes or night sweats.  She is not concerned about the contraception aspect ("you have to have sex to get pregnant"). She has not seen a GYN since her last time here.  Took iron for about 3 months after last check, not since.   PMH, PSH, SH and FH reviewed and updated  Immunization History  Administered Date(s) Administered  . Influenza,inj,Quad PF,6+ Mos 12/01/2015, 12/15/2016, 03/09/2018  . Tdap 10/04/2013    Outpatient Encounter Medications as of 03/09/2018  Medication Sig Note  . buPROPion (WELLBUTRIN XL) 300 MG 24 hr tablet TAKE 1 TABLET BY MOUTH EVERY DAY   . Magnesium 500 MG CAPS Take 1,000 mg by mouth at bedtime.   . Multiple Vitamin (MULTIVITAMIN WITH MINERALS) TABS Take 1 tablet by mouth every morning. Reported on 04/07/2015   . Probiotic Product (PROBIOTIC DAILY PO) Take 1 capsule by mouth daily. Reported on 04/07/2015   . Pyridoxine HCl (VITAMIN B-6) 500 MG tablet Take 500 mg by mouth daily.   . vitamin B-12 (CYANOCOBALAMIN) 500 MCG tablet Take 500 mcg by mouth every morning. Reported on 04/07/2015 12/15/2016: Takes a super-B pill that has both the B6 and B12 together   No facility-administered encounter medications on file as of 03/09/2018.    No Known Allergies  PHYSICAL EXAM:  BP 110/76   Pulse 76   Ht 5' 5.5" (1.664 m)   Wt 199  lb (90.3 kg)   LMP 03/06/2018 (Exact Date)   BMI 32.61 kg/m   Wt Readings from Last 3 Encounters:  03/09/18 199 lb (90.3 kg)  12/15/16 194 lb (88 kg)  12/01/15 190 lb (86.2 kg)   Well-appearing, pleasant female, in good spirits. HEENT: conjunctiva and sclera are clear, EOMI. OP is clear Neck: no lymphadenopathy, thyromegaly, carotid bruit Heart: regular rate and rhythm, no  Murmur Lungs: clear bilaterally Back: no spinal or CVA tenderness Abdomen: soft, nontender, no mass Extremities: no edema Psych: normal mood, affect, hygiene and grooming. Normal speech and eye contact Neuro: alert  and oriented, cranial nerves intact, normal gait.  PHQ-9 score of 2   ASSESSMENT/PLAN:  Iron deficiency anemia, unspecified iron deficiency anemia type - periods are not as heavy; due for recheck - Plan: CBC with Differential/Platelet, Ferritin, Iron  Depression, major, in remission ([place]) - Doing well on Wellbutrin, with some ongoing work stressors. Continue Wellbutrin. Discussed exercise, stress reduction - Plan: buPROPion (WELLBUTRIN XL) 300 MG 24 hr tablet  Need for influenza vaccination - Plan: Flu Vaccine QUAD 6+ mos PF IM (Fluarix Quad PF)  BMI 32.0-32.9,adult - counseled re: diet, exercise, risks; encouraged weight loss  IUD (intrauterine device) in place - likely past due for removal. No problems/pain, discussed efficacy. She desires removal, has no GYN, wants done here  Vaccine counseling - discussed Shingrix in detail--to check with her insurance (pharmacy coverage vs MD's office) and get in [facility]  Shingrix recommended, risks/SE reviewed, to check insurance. Mammo is past due, encouraged to schedule Will discuss colon cancer screening at her physical (not addressed today).   CBC, ferritin, c-met, TSH Patient is not fasting today.  Last lipids reviewed: Lab Results  Component Value Date   CHOL 129 10/04/2013   HDL 62 10/04/2013   LDLCALC 52 10/04/2013   TRIG 74 10/04/2013   CHOLHDL 2.1 10/04/2013   Can come fasting to her physical for lipid recheck.  She will be coming to [facility] in February when her mother has breast surgery.  Advised that if she gets [place] the dates, we can see if we can work her in for CPE at that time (probably can at least do her IUD removal).  She is currently scheduled for CPE in June.    Please schedule a yearly mammogram Consider finding a GYN in [place]--if not, let us know the exact dates you will be here in February and we can look to see if we can squeeze in you in. We can remove the IUD at that time, if you'd like.  I recommend getting the new  shingles vaccine (Shingrix). You will need to check with your insurance to see if it is covered, if there is a difference in coverage/cost whether given at the doctor vs at the pharmacy.  It is a series of 2 injections, spaced 2 months apart.

## 2018-03-09 ENCOUNTER — Encounter: Payer: Self-pay | Admitting: Family Medicine

## 2018-03-09 ENCOUNTER — Ambulatory Visit (INDEPENDENT_AMBULATORY_CARE_PROVIDER_SITE_OTHER): Payer: 59 | Admitting: Family Medicine

## 2018-03-09 VITALS — BP 110/76 | HR 76 | Ht 65.5 in | Wt 199.0 lb

## 2018-03-09 DIAGNOSIS — Z975 Presence of (intrauterine) contraceptive device: Secondary | ICD-10-CM

## 2018-03-09 DIAGNOSIS — Z7189 Other specified counseling: Secondary | ICD-10-CM

## 2018-03-09 DIAGNOSIS — D509 Iron deficiency anemia, unspecified: Secondary | ICD-10-CM

## 2018-03-09 DIAGNOSIS — Z6832 Body mass index (BMI) 32.0-32.9, adult: Secondary | ICD-10-CM | POA: Diagnosis not present

## 2018-03-09 DIAGNOSIS — Z7185 Encounter for immunization safety counseling: Secondary | ICD-10-CM

## 2018-03-09 DIAGNOSIS — Z23 Encounter for immunization: Secondary | ICD-10-CM | POA: Diagnosis not present

## 2018-03-09 DIAGNOSIS — F325 Major depressive disorder, single episode, in full remission: Secondary | ICD-10-CM | POA: Diagnosis not present

## 2018-03-09 MED ORDER — BUPROPION HCL ER (XL) 300 MG PO TB24: 300.0000 mg | ORAL_TABLET | Freq: Every day | ORAL | 3 refills | Status: DC

## 2018-03-09 NOTE — Patient Instructions (Signed)
  Please schedule a yearly mammogram Consider finding a GYN in [place]--if not, let us know the exact dates you will be here in February and we can look to see if we can squeeze in you in. We can remove the IUD at that time, if you'd like.  I recommend getting the new shingles vaccine (Shingrix). You will need to check with your insurance to see if it is covered, if there is a difference in coverage/cost whether given at the doctor vs at the pharmacy.  It is a series of 2 injections, spaced 2 months apart.

## 2018-03-10 LAB — CBC WITH DIFFERENTIAL/PLATELET
Basophils Absolute: 0.1 10*3/uL (ref 0.0–0.2)
Basos: 1 %
EOS (ABSOLUTE): 0.1 10*3/uL (ref 0.0–0.4)
Eos: 2 %
HEMOGLOBIN: 15 g/dL (ref 11.1–15.9)
Hematocrit: 45.7 % (ref 34.0–46.6)
IMMATURE GRANULOCYTES: 0 %
Immature Grans (Abs): 0 10*3/uL (ref 0.0–0.1)
Lymphocytes Absolute: 1.8 10*3/uL (ref 0.7–3.1)
Lymphs: 33 %
MCH: 28 pg (ref 26.6–33.0)
MCHC: 32.8 g/dL (ref 31.5–35.7)
MCV: 85 fL (ref 79–97)
Monocytes Absolute: 0.5 10*3/uL (ref 0.1–0.9)
Monocytes: 8 %
NEUTROS PCT: 56 %
Neutrophils Absolute: 3 10*3/uL (ref 1.4–7.0)
Platelets: 320 10*3/uL (ref 150–450)
RBC: 5.35 x10E6/uL — AB (ref 3.77–5.28)
RDW: 14.5 % (ref 12.3–15.4)
WBC: 5.4 10*3/uL (ref 3.4–10.8)

## 2018-03-10 LAB — FERRITIN: Ferritin: 13 ng/mL — ABNORMAL LOW (ref 15–150)

## 2018-03-10 LAB — IRON: IRON: 120 ug/dL (ref 27–159)

## 2018-06-13 ENCOUNTER — Telehealth: Payer: Self-pay | Admitting: Family Medicine

## 2018-06-13 NOTE — Telephone Encounter (Signed)
Your patient. Thought you might want to handle.

## 2018-06-13 NOTE — Telephone Encounter (Signed)
Pt called and needs refill of Wellbutrin sent to the [facility] in [place]. She can be reached at [phone]

## 2018-06-13 NOTE — Telephone Encounter (Signed)
When I called the number listed to call back, it was taylor. pts' daughter needed a refill on wellbutrin.

## 2018-06-13 NOTE — Telephone Encounter (Signed)
She was written for a year supply at her visit in December 2019.  She should have refills at her pharmacy. Please advise pt

## 2018-09-09 NOTE — Progress Notes (Signed)
Chief Complaint  Patient presents with  . Annual Exam    fasting annual exam with pap. No new concerns.     Priscilla Bautista is a 53 y.o. female who presents for a complete physical.   Depression/anxiety:  Last seen here 6 months ago.  She currently lives in [place].  She is doing well on Wellbutrin. Moods are good.  +work stress. Per prior notes, prior to takingWellbutrinshe fixatedon things, irritable, crying.She feels so much better since being put back on Wellbutrin in 09/2013. No interest in stopping this medication. She denies any side effects. Rx was written for a year when seen in December, no refills needed.  She has a history of heavy menses, and iron deficiency anemia.  Hg was 11.0 in 12/2016. She took iron for 3 months, and repeat done at last visit was normal. Cycles are no longer as heavy as they had been. Cycles are slightly irregular, every 2 months.  Denies hot flashes or night sweats.  Lab Results  Component Value Date   WBC 5.4 03/09/2018   HGB 15.0 03/09/2018   HCT 45.7 03/09/2018   MCV 85 03/09/2018   PLT 320 03/09/2018   Lab Results  Component Value Date   IRON 120 03/09/2018   FERRITIN 13 (L) 03/09/2018    She has Paragard IUD (last GYN confirmed the presence of Paragard strings)--date of insertion not known, not in [facility] records (they did not insert).She does not recall when the IUD was placed (states it was through [employer], was unable to get records, thinks that was not the correct practice, and never looked into it further). Veronica and patient both tried to get information (unsure of which practice). She continues to have periods, but they are not as heavy as in the past.   She has not seen a GYN since her last time here. She is in a sexual relationship with her husband.   Immunization History  Administered Date(s) Administered  . Influenza,inj,Quad PF,6+ Mos 12/01/2015, 12/15/2016, 03/09/2018  . Tdap 10/04/2013   Last Pap smear:  12/2016, no high risk HPV (some shift in flora c/w BV) Last mammogram: 09/27/14 at [facility] Last colonoscopy: never Last DEXA: never Dentist: twice yearly Ophtho: wears glasses, overdue (plans to schedule in [facility]) Exercise: walks 2-3 times/week, for an hour; some yoga, 1-2x/week. No other weight-bearing exercise . Starting to do [employer], has weights at home to use with class. Diet: Has dairy (cheese daily, some yogurt every other day), eats lots of greens. Only very sporadic with calcium supplements. Takes MVI daily. Lipid screen: Lab Results  Component Value Date   CHOL 129 10/04/2013   HDL 62 10/04/2013   LDLCALC 52 10/04/2013   TRIG 74 10/04/2013   CHOLHDL 2.1 10/04/2013   Vitamin D screen normal in 09/2013 (level of 42).  Past Medical History:  Diagnosis Date  . Depression 09/2013  . Hepatic hemangioma 2015   see MRI  . Iron deficiency anemia 09/2013  . Papanicolaou smear of cervix with positive high risk human papilloma virus (HPV) test 08/2013  . Postpartum depression 2000   took Wellbutrin    Past Surgical History:  Procedure Laterality Date  . ECTOPIC PREGNANCY SURGERY      Social History   Socioeconomic History  . Marital status: Married    Spouse name: Laurey Arrow  . Number of children: 2  . Years of education: Not on file  . Highest education level: Not on file  Occupational History  .  Occupation: Production designer, theatre/television/film: [employer]  Social Needs  . Financial resource strain: Not on file  . Food insecurity    Worry: Not on file    Inability: Not on file  . Transportation needs    Medical: Not on file    Non-medical: Not on file  Tobacco Use  . Smoking status: Former Smoker    Packs/day: 0.30    Years: 26.00    Pack years: 7.80    Types: Cigarettes    Quit date: 12/16/2013    Years since quitting: 4.7  . Smokeless tobacco: Never Used  Substance and Sexual Activity  . Alcohol use: Yes    Comment: 1 glass of  wine per night, 5-7 (6 oz) glasses per week  . Drug use: No  . Sexual activity: Yes    Partners: Male    Birth control/protection: I.U.D.  Lifestyle  . Physical activity    Days per week: Not on file    Minutes per session: Not on file  . Stress: Not on file  Relationships  . Social Herbalist on phone: Not on file    Gets together: Not on file    Attends religious service: Not on file    Active member of club or organization: Not on file    Attends meetings of clubs or organizations: Not on file    Relationship status: Not on file  . Intimate partner violence    Fear of current or ex partner: Not on file    Emotionally abused: Not on file    Physically abused: Not on file    Forced sexual activity: Not on file  Other Topics Concern  . Not on file  Social History Narrative   Married, 2 daughters.  2 dogs (yorkie-poo)   Moved to [place], [place] for a new job in 12/2016.   Daughters are in [place] (though home 08/2018 due to COVID-19; thinking of moving to [place]); and youngest is attending [employer]    Family History  Problem Relation Age of Onset  . Hypertension Mother   . Cancer Father        multiple myeloma; lung cancer  . Depression Sister        bipolar possibly  . Cancer Maternal Grandmother        leukemia  . Cancer Maternal Grandfather        lung  . Stroke Paternal Grandfather   . Depression Daughter   . Diabetes Neg Hx   . Heart disease Neg Hx     Outpatient Encounter Medications as of 09/11/2018  Medication Sig Note  . buPROPion (WELLBUTRIN XL) 300 MG 24 hr tablet Take 1 tablet (300 mg total) by mouth daily.   . cholecalciferol (VITAMIN D3) 25 MCG (1000 UT) tablet Take 1,000 Units by mouth daily.   . Magnesium 500 MG CAPS Take 1,000 mg by mouth at bedtime.   . Multiple Vitamin (MULTIVITAMIN WITH MINERALS) TABS Take 1 tablet by mouth every morning. Reported on 04/07/2015   . Probiotic Product (PROBIOTIC DAILY PO) Take 1 capsule by mouth daily. Reported on  04/07/2015   . Pyridoxine HCl (VITAMIN B-6) 500 MG tablet Take 500 mg by mouth daily.   . vitamin B-12 (CYANOCOBALAMIN) 500 MCG tablet Take 500 mcg by mouth every morning. Reported on 04/07/2015 12/15/2016: Takes a super-B pill that has both the B6 and B12 together   No facility-administered encounter medications on file as of 09/11/2018.  No Known Allergies  ROS: The patient denies anorexia, fever, headaches, vision changes, decreased hearing, ear pain, sore throat, breast concerns, chest pain, palpitations, dizziness, syncope, dyspnea on exertion, cough, swelling, nausea, vomiting, diarrhea, constipation, abdominal pain, melena, hematochezia, indigestion/heartburn, hematuria, incontinence, dysuria, vaginal discharge, odor or itch, genital lesions, joint pains, numbness, tingling, weakness, tremor, suspicious skin lesions, depression, abnormal bleeding/bruising, or enlarged lymph nodes. Periods have been less frequent (every 2 mos), less heavy. No hot flashes. She has noted some inches in her waist measurement. Slight PND/allergies when in [place].    PHYSICAL EXAM:  BP 128/76   Pulse 80   Temp (!) 96.2 F (35.7 C) (Temporal)   Ht 5' 5.5" (1.664 m)   Wt 198 lb 12.8 oz (90.2 kg)   BMI 32.58 kg/m   Wt Readings from Last 3 Encounters:  09/11/18 198 lb 12.8 oz (90.2 kg)  03/09/18 199 lb (90.3 kg)  12/15/16 194 lb (88 kg)    General Appearance:  Alert, cooperative, no distress, appears stated age  Head:  Normocephalic, without obvious abnormality, atraumatic  Eyes:  PERRL, conjunctiva/corneas clear, EOM's intact, fundi benign  Ears:  Normal TM's and external ear canals  Nose: Not examined (wearing mask due to COVID-19)  Throat: Not examined (wearing mask due to COVID-19)  Neck: Supple, no lymphadenopathy; thyroid: no enlargement/tenderness/ nodules; no carotid bruit or JVD  Back:  Spine nontender, no curvature, ROM normal, no CVAtenderness  Lungs:  Clear to  auscultation bilaterally without wheezes, rales orronchi; respirations unlabored  Chest Wall:  No tenderness or deformity  Heart:  Regular rate and rhythm, S1 and S2 normal, no murmur, rub or gallop  Breast Exam:  normal appearance--no skin dimpling, nipple inversion, nipple discharge, palpable masses or axillary lymphadenopathy.   Abdomen:  Soft, non-tender, nondistended, normoactive bowel sounds, no masses, no hepatosplenomegaly  Genitalia:  normal external genitalia without lesions. BUS and vagina normal; no abnormal discharge. IUD strings are palpable at the cervix on bimanual exam.No cervical motion tenderness. Uterus and adnexa are normal, not enlarged, nontender, no mass. Pap not obtained  Rectal: Normal sphincter tone, no mass. Heme negative stool  Extremities: No clubbing, cyanosis or edema  Pulses: 2+ and symmetric all extremities  Skin: Skin color, texture, turgor normal, no rashes or lesions.   Lymph nodes: Cervical, supraclavicular, and axillary nodes normal  Neurologic: CNII-XII intact, normal strength, sensation and gait; reflexes 2+ and symmetric throughout  Psych: Normal mood, affect, hygiene, grooming, eye contact, speech   ASSESSMENT/PLAN:  Annual physical exam - Plan: Comprehensive metabolic panel, CBC with Differential/Platelet, Lipid panel, TSH  Depression, major, in remission ([place]) - continue wellbutrin daily   IUD (intrauterine device) in place - may be past due for removal. In proper location, without s/sx infection. Discussed potential decreased efficacy for contraception.   Colon cancer screening - prefers Cologuard rather than colonoscopy, referral done, counseled on need for colonoscopy if abnormal result - Plan: Cologuard  Class 1 obesity due to excess calories without serious comorbidity with body mass index (BMI) of 32.0 to 32.9 in adult - counseled re: risks of obesity, healthy diet, portions, exercise, encouraged wt  loss   Past due for mammo--encouraged to get 3D mammo yearly.  IUD removal needed--not done today.  Can return on another day (and pre-med with ibuprofen) vs find doctor in [facility] to remove. Encouraged condoms for contraception, since still having cycles.  CBC, c-met, lipids, TSH Cologuard  Discussed monthly self breast exams and yearly mammograms (past due, advised to  schedule); at least 30 minutes of aerobic activity at least 5 days/week, weight-bearing exercises 2x/week; proper sunscreen use reviewed; healthy diet, including goals of calcium and vitamin D intake and alcohol recommendations (less than or equal to 1 drink/day) reviewed; regular seatbelt use; changing batteries in smoke detectors. Immunization recommendations discussed--yearly flu shots are recommended.  Shingrix is recommended (risks/SE reviewed)--rec she get in [facility], to check insurance to see if can get at pharmacy vs waiting until she establishes with PCP.  Colonoscopy recommendations reviewed, past due. Discussed colonoscopy and Cologuard in detail, prefers Cologuard. Referral done.  Recommended that she find PCP in [place](for mammo, shingrix, IUD removal). Due for routine eye exam.  F/u 1 year for CPE (here vs finding CPE in [facility], which was encouraged--she wanted to set one up here just in case).

## 2018-09-09 NOTE — Patient Instructions (Addendum)
  HEALTH MAINTENANCE RECOMMENDATIONS:  It is recommended that you get at least 30 minutes of aerobic exercise at least 5 days/week (for weight loss, you may need as much as 60-90 minutes). This can be any activity that gets your heart rate up. This can be divided in 10-15 minute intervals if needed, but try and build up your endurance at least once a week.  Weight bearing exercise is also recommended twice weekly.  Eat a healthy diet with lots of vegetables, fruits and fiber.  "Colorful" foods have a lot of vitamins (ie green vegetables, tomatoes, red peppers, etc).  Limit sweet tea, regular sodas and alcoholic beverages, all of which has a lot of calories and sugar.  Up to 1 alcoholic drink daily may be beneficial for women (unless trying to lose weight, watch sugars).  Drink a lot of water.  Calcium recommendations are 1200-1500 mg daily (1500 mg for postmenopausal women or women without ovaries), and vitamin D 1000 IU daily.  This should be obtained from diet and/or supplements (vitamins), and calcium should not be taken all at once, but in divided doses.  Monthly self breast exams and yearly mammograms for women over the age of 107 is recommended.  Sunscreen of at least SPF 30 should be used on all sun-exposed parts of the skin when outside between the hours of 10 am and 4 pm (not just when at beach or pool, but even with exercise, golf, tennis, and yard work!)  Use a sunscreen that says "broad spectrum" so it covers both UVA and UVB rays, and make sure to reapply every 1-2 hours.  Remember to change the batteries in your smoke detectors when changing your clock times in the spring and fall. Carbon monoxide detectors are recommended for your home.  Use your seat belt every time you are in a car, and please drive safely and not be distracted with cell phones and texting while driving.  I recommend getting the new shingles vaccine (Shingrix). You will need to check with your insurance to see if it  is covered, and check whether it needs to be gotten from doctor office vs pharmacy.  It is a series of 2 injections, spaced 2 months apart.   You are due for colon cancer screening. We discussed options of colonoscopy and Cologuard testing. We will refer for Cologuard (look for the box to come in the mail).  It is probably best to find a PCP in [place], so that it is more accessible for you to have routine care. I'm happy to resume once you're back in [place].  You need to have your IUD removed, you need your yearly mammograms done (3D). Please schedule a routine eye exam as well.

## 2018-09-11 ENCOUNTER — Encounter: Payer: 59 | Admitting: Family Medicine

## 2018-09-11 ENCOUNTER — Ambulatory Visit (INDEPENDENT_AMBULATORY_CARE_PROVIDER_SITE_OTHER): Payer: 59 | Admitting: Family Medicine

## 2018-09-11 ENCOUNTER — Encounter: Payer: Self-pay | Admitting: Family Medicine

## 2018-09-11 ENCOUNTER — Other Ambulatory Visit: Payer: Self-pay

## 2018-09-11 VITALS — BP 128/76 | HR 80 | Temp 96.2°F | Ht 65.5 in | Wt 198.8 lb

## 2018-09-11 DIAGNOSIS — E6609 Other obesity due to excess calories: Secondary | ICD-10-CM

## 2018-09-11 DIAGNOSIS — Z975 Presence of (intrauterine) contraceptive device: Secondary | ICD-10-CM | POA: Diagnosis not present

## 2018-09-11 DIAGNOSIS — F325 Major depressive disorder, single episode, in full remission: Secondary | ICD-10-CM | POA: Diagnosis not present

## 2018-09-11 DIAGNOSIS — Z0000 Encounter for general adult medical examination without abnormal findings: Secondary | ICD-10-CM

## 2018-09-11 DIAGNOSIS — Z1211 Encounter for screening for malignant neoplasm of colon: Secondary | ICD-10-CM

## 2018-09-11 DIAGNOSIS — Z6832 Body mass index (BMI) 32.0-32.9, adult: Secondary | ICD-10-CM

## 2018-09-12 LAB — COMPREHENSIVE METABOLIC PANEL
ALT: 19 IU/L (ref 0–32)
AST: 18 IU/L (ref 0–40)
Albumin/Globulin Ratio: 1.7 (ref 1.2–2.2)
Albumin: 4.4 g/dL (ref 3.8–4.9)
Alkaline Phosphatase: 55 IU/L (ref 39–117)
BUN/Creatinine Ratio: 13 (ref 9–23)
BUN: 11 mg/dL (ref 6–24)
Bilirubin Total: 0.3 mg/dL (ref 0.0–1.2)
CO2: 23 mmol/L (ref 20–29)
Calcium: 8.9 mg/dL (ref 8.7–10.2)
Chloride: 103 mmol/L (ref 96–106)
Creatinine, Ser: 0.84 mg/dL (ref 0.57–1.00)
GFR calc Af Amer: 93 mL/min/{1.73_m2} (ref >59)
GFR calc non Af Amer: 81 mL/min/{1.73_m2} (ref >59)
Globulin, Total: 2.6 g/dL (ref 1.5–4.5)
Glucose: 93 mg/dL (ref 65–99)
Potassium: 4.4 mmol/L (ref 3.5–5.2)
Sodium: 141 mmol/L (ref 134–144)
Total Protein: 7 g/dL (ref 6.0–8.5)

## 2018-09-12 LAB — CBC WITH DIFFERENTIAL/PLATELET
Basophils Absolute: 0.1 10*3/uL (ref 0.0–0.2)
Basos: 1 %
EOS (ABSOLUTE): 0.1 10*3/uL (ref 0.0–0.4)
Eos: 3 %
Hematocrit: 38.7 % (ref 34.0–46.6)
Hemoglobin: 11.8 g/dL (ref 11.1–15.9)
Immature Grans (Abs): 0 10*3/uL (ref 0.0–0.1)
Immature Granulocytes: 0 %
Lymphocytes Absolute: 1.6 10*3/uL (ref 0.7–3.1)
Lymphs: 34 %
MCH: 23.4 pg — ABNORMAL LOW (ref 26.6–33.0)
MCHC: 30.5 g/dL — ABNORMAL LOW (ref 31.5–35.7)
MCV: 77 fL — ABNORMAL LOW (ref 79–97)
Monocytes Absolute: 0.4 10*3/uL (ref 0.1–0.9)
Monocytes: 8 %
Neutrophils Absolute: 2.5 10*3/uL (ref 1.4–7.0)
Neutrophils: 54 %
Platelets: 360 10*3/uL (ref 150–450)
RBC: 5.05 x10E6/uL (ref 3.77–5.28)
RDW: 15.5 % — ABNORMAL HIGH (ref 11.7–15.4)
WBC: 4.6 10*3/uL (ref 3.4–10.8)

## 2018-09-12 LAB — TSH: TSH: 1.71 u[IU]/mL (ref 0.450–4.500)

## 2018-09-12 LAB — LIPID PANEL
Chol/HDL Ratio: 2.3 ratio (ref 0.0–4.4)
Cholesterol, Total: 176 mg/dL (ref 100–199)
HDL: 78 mg/dL (ref >39)
LDL Calculated: 75 mg/dL (ref 0–99)
Triglycerides: 116 mg/dL (ref 0–149)
VLDL Cholesterol Cal: 23 mg/dL (ref 5–40)

## 2019-03-19 ENCOUNTER — Other Ambulatory Visit: Payer: Self-pay | Admitting: Family Medicine

## 2019-03-19 DIAGNOSIS — F325 Major depressive disorder, single episode, in full remission: Secondary | ICD-10-CM

## 2019-06-07 ENCOUNTER — Encounter: Payer: Self-pay | Admitting: *Deleted

## 2020-01-09 ENCOUNTER — Other Ambulatory Visit: Payer: Self-pay | Admitting: Family Medicine

## 2020-01-09 DIAGNOSIS — F325 Major depressive disorder, single episode, in full remission: Secondary | ICD-10-CM

## 2020-01-09 NOTE — Telephone Encounter (Signed)
Is this okay to refill? Has CPE in March.

## 2020-05-15 ENCOUNTER — Encounter: Payer: 59 | Admitting: Family Medicine

## 2020-07-17 ENCOUNTER — Encounter: Payer: Self-pay | Admitting: *Deleted

## 2020-07-17 ENCOUNTER — Other Ambulatory Visit: Payer: Self-pay | Admitting: Family Medicine

## 2020-07-17 DIAGNOSIS — F325 Major depressive disorder, single episode, in full remission: Secondary | ICD-10-CM

## 2020-07-17 NOTE — Telephone Encounter (Signed)
Patient is supposed to finding a doctor in [place] 3 times today and sent message to find out if she needs this refill or not.

## 2020-07-21 NOTE — Telephone Encounter (Signed)
Patient called me back and scheduled med check for 08/13/20-asking for a 30 days refill on this medication until she comes in to see you.

## 2020-08-11 NOTE — Progress Notes (Deleted)
  Patient presents for follow-up on depression.  She hasn't been seen in the office (or virtually) since 08/2018.  She has cancelled appointments, as she has moved away (moved to [facility], moved back for a short time only, now lives in [place] She continues to do well on Wellbutrin. She was given a 30 day refill on 07/21/20.  Per prior notes, prior to takingWellbutrinshe fixatedon things, irritable, crying.She feels so much better since being put back on Wellbutrin in 09/2013. No interest in stopping this medication. She denies any side effects.    She has Paragard IUD (last GYN confirmed the presence of Paragard strings)--date of insertion not known, not in [facility] records (they did not insert).She does not recall when the IUD was placed (states it was through [employer], was unable to get records, thinks that was not the correct practice, and never looked into it further). Veronica and patient both tried to get information (unsure of which practice).   Still having periods??? She has not seen a GYN since her last time here. ??  Immunization History  Administered Date(s) Administered  . Influenza,inj,Quad PF,6+ Mos 12/01/2015, 12/15/2016, 03/09/2018  . PFIZER(Purple Top)SARS-COV-2 Vaccination 06/26/2019  . Tdap 10/04/2013    HAS SHE SEEN GYN SINCE HER LAST PHYSICAL HERE IN 08/2018??  DOES SHE STILL HAVE HER IUD IN PLACE? ENTER COVID VACCINES DID SHE EVER GET SHINGRIX? WE REFERRED FOR COLOGUARD IN 08/2018--I DON'T THINK SHE EVER RETURNED IT. PAST DUE FOR COLON CA SCREENING ANY MAMMO??  Past due for mammo, pap, colon cancer screening  SHE NEEDS TO FIND A PCP WHERE SHE LIVES!!!

## 2020-08-12 ENCOUNTER — Telehealth: Payer: Self-pay | Admitting: Family Medicine

## 2020-08-12 DIAGNOSIS — F325 Major depressive disorder, single episode, in full remission: Secondary | ICD-10-CM

## 2020-08-12 MED ORDER — BUPROPION HCL ER (XL) 300 MG PO TB24: 1.0000 | ORAL_TABLET | Freq: Every day | ORAL | 0 refills | Status: DC

## 2020-08-12 NOTE — Telephone Encounter (Signed)
She has not been seen in this office in two years.  She cancels any appointments she has made (and hasn't even had virtual visits, which clearly at this point isn't appropriate, since it has been so long since seen).   I'm sending in 30 days only, and will not refill this for her anymore.. I think she needs to be dismissed from this practice (sending to Beverlee Nims too). She lives out of state, and doesn't seem to be able to keep the appointments she makes when she is in town visiting family.  She never even made one for 2021, and she canceled her March 2022 appointment.   She knew when her flight was, and should have known when her appointment was, but she cancelled it the day before the visit, taking it away from someone else.  She should call and arrange for a PCP appointment in [place] now, for when she gets back from her trip.  I cannot be her doctor when she lives out of state, and when she doesn't come for appointments when here.  It just isn't good care.  I had prepped her chart already, and she is far behind in health maintenance items.  She needs to find a local doctor and take care of herself.    Please call the husband and let him know I sent in 30d. Not sure which one of you wants to call the patient or the husband to tell him this.    (Her mother and sister, and previously her father, until he passed, are my patients, but it is NOT appropriate for her to still be under my care when living out of state, and has been out of state--between [facility] and now [place]) for years.

## 2020-08-12 NOTE — Telephone Encounter (Signed)
Pt husband called and canceled appt for tomorrow states pt will be on flight back,  Pt needs refill on Wellbutrin, pt only has a few left and she is getting ready to go to [place] for a month and will not have enough to last her, he would like to pick them up tonight or in the morning before he leaves [place], they are getting ready to head back to [place]  He states pt has not found a pcp,  [facility] [address] He would like a call back at [phone] inofrmed husband u was not in the office today

## 2020-08-13 ENCOUNTER — Encounter: Payer: Self-pay | Admitting: Family Medicine

## 2020-08-13 NOTE — Telephone Encounter (Signed)
Sending dismissal letter.

## 2020-08-13 NOTE — Telephone Encounter (Signed)
I do not feel comfortable calling pt and telling pt she will be dismissed cause I know he will have a lot of questions about it

## 2020-08-13 NOTE — Telephone Encounter (Signed)
Called pt husband and advised we would be filling the medication one last time.  That it is not good care to prescribe without being able to see a patient and care for them.  They are now living in [place].  Dismissal letter sent.

## 2020-08-13 NOTE — Telephone Encounter (Signed)
Please return call to husband.

## 2020-08-16 ENCOUNTER — Other Ambulatory Visit: Payer: Self-pay | Admitting: Family Medicine

## 2020-08-16 DIAGNOSIS — F325 Major depressive disorder, single episode, in full remission: Secondary | ICD-10-CM

## 2020-09-08 ENCOUNTER — Other Ambulatory Visit: Payer: Self-pay | Admitting: Family Medicine

## 2020-09-08 DIAGNOSIS — F325 Major depressive disorder, single episode, in full remission: Secondary | ICD-10-CM

## 2021-03-30 LAB — HM PAP SMEAR: HM Pap smear: NEGATIVE

## 2022-01-12 NOTE — Progress Notes (Unsigned)
No chief complaint on file.    Care Everywhere reviewed, from doctors in [place]. She underwent fluoroscopic guided R hip injection in 10/2021. She was referred to [facility] to discuss weight loss in 10/2021. Last CPE was 01/2021.  Labs were ordered, never done. Had Cologuard, ?ever returned. Doesn't look like any vaccines were given. pfizer 06/26/2019, 07/20/2019, 08/15/2020 was in her chart   [facility], [place], [place], [place] reviewed and updated   PHYSICAL EXAM:  There were no vitals taken for this visit.  Wt Readings from Last 3 Encounters:  09/11/18 198 lb 12.8 oz (90.2 kg)  03/09/18 199 lb (90.3 kg)  12/15/16 194 lb (88 kg)      ASSESSMENT/PLAN:  Please abstract the COVID vaccines reported above. Offer flu, COVID, decline if refused. Shingrix rec--to check insurance and schedule [place]  HAS SHE HAD MAMMO?  Last we had reported was in 2016 from GYN DID [place] IUD REMOVED???  SHE IS SCHEDULED FOR 01/21/22 FOR CPE.  PER RECORDS, LAST CPE WAS 01/22/21.   SHE NEEDS TO CHECK INSURANCE TO SEE IF THIS IS OK, OR IF NEEDS TO BE SCHEDULED LATER (if too soon). ?on same insurance??

## 2022-01-13 ENCOUNTER — Ambulatory Visit: Payer: Managed Care, Other (non HMO) | Admitting: Family Medicine

## 2022-01-13 ENCOUNTER — Encounter: Payer: Self-pay | Admitting: Family Medicine

## 2022-01-13 VITALS — BP 110/70 | HR 86 | Wt 201.0 lb

## 2022-01-13 DIAGNOSIS — Z975 Presence of (intrauterine) contraceptive device: Secondary | ICD-10-CM | POA: Diagnosis not present

## 2022-01-13 DIAGNOSIS — Z6832 Body mass index (BMI) 32.0-32.9, adult: Secondary | ICD-10-CM

## 2022-01-13 DIAGNOSIS — M1611 Unilateral primary osteoarthritis, right hip: Secondary | ICD-10-CM

## 2022-01-13 DIAGNOSIS — N951 Menopausal and female climacteric states: Secondary | ICD-10-CM | POA: Diagnosis not present

## 2022-01-13 DIAGNOSIS — F332 Major depressive disorder, recurrent severe without psychotic features: Secondary | ICD-10-CM

## 2022-01-13 MED ORDER — BUPROPION HCL ER (XL) 150 MG PO TB24: 450.0000 mg | ORAL_TABLET | Freq: Every day | ORAL | 1 refills | Status: DC

## 2022-01-13 NOTE — Patient Instructions (Addendum)
Call your insurance company to see if your physical needs to be a year + 1 day from the last, or if it can be done 1 day early (it was 01/22/21, scheduled for 01/21/22). Call us to reschedule if it needs to be.   Increase the wellbutrin to 450 mg (take three of the '150mg'$ ). We discussed the alternative of keeping the wellbutrin at '300mg'$  and adding an SSRI (ie lexapro, zoloft, paxil, etc).  These may help with hot flashes

## 2022-01-14 DIAGNOSIS — M1611 Unilateral primary osteoarthritis, right hip: Secondary | ICD-10-CM | POA: Insufficient documentation

## 2022-01-18 ENCOUNTER — Encounter: Payer: Self-pay | Admitting: *Deleted

## 2022-01-21 ENCOUNTER — Encounter: Payer: Self-pay | Admitting: Family Medicine

## 2022-01-24 NOTE — Progress Notes (Unsigned)
No chief complaint on file.   Priscilla Bautista is a 56 y.o. female who presents for a complete physical.   Depression:  Wellbutrin dose was increased on 11/1 to 482m daily (declined adding SSRI).  She is tolerating this without side effects.  She has Paragard IUD.  She cannot recall the date of insertion, and we were unable to get records (when we tried in 2020) for when/where it was placed. [place]'t removed. She denies any pelvic pain, abnormal vaginal discharge or bleeding. Last period was in June.  She has some hot flashes, but not as bad as previously.  Immunization History  Administered Date(s) Administered   Influenza,inj,Quad PF,6+ Mos 12/01/2015, 12/15/2016, 03/09/2018   PFIZER Comirnaty(Gray Top)Covid-19 Tri-Sucrose Vaccine 06/26/2019, 07/20/2019, 08/15/2020   PFIZER(Purple Top)SARS-COV-2 Vaccination 06/26/2019   Tdap 10/04/2013   Last Pap smear: 03/2021 Last mammogram: Had mammogram 03/2021, had no comparisons, [place] Found breast cysts, bilaterally, benign.  Last colonoscopy: never. Previously got Cologuard, never returned it. Last DEXA: never Dentist: twice yearly Ophtho:  Exercise:  limited by hip pain  Diet: Has dairy (cheese daily, some yogurt every other day), eats lots of greens. Only very sporadic with calcium supplements.  Takes MVI daily. Lipid screen: Lab Results  Component Value Date   CHOL 176 09/11/2018   HDL 78 09/11/2018   LDLCALC 75 09/11/2018   TRIG 116 09/11/2018   CHOLHDL 2.3 09/11/2018    Vitamin D screen normal in 09/2013 (level of 42).    ROS:  The patient denies anorexia, fever, headaches,  vision changes, decreased hearing, ear pain, sore throat, breast concerns, chest pain, palpitations, dizziness, syncope, dyspnea on exertion, cough, swelling, nausea, vomiting, diarrhea, constipation, abdominal pain, melena, hematochezia, indigestion/heartburn, hematuria, incontinence, dysuria, vaginal  bleeding, discharge, odor or itch, genital lesions, joint pains, numbness, tingling, weakness, tremor, suspicious skin lesions, depression, abnormal bleeding/bruising, or enlarged lymph nodes. Hot flashes Depression R hip pain   PHYSICAL EXAM:  There were no vitals taken for this visit.  Wt Readings from Last 3 Encounters:  01/13/22 201 lb (91.2 kg)  09/11/18 198 lb 12.8 oz (90.2 kg)  03/09/18 199 lb (90.3 kg)    General Appearance:    Alert, cooperative, no distress, appears stated age  Head:    Normocephalic, without obvious abnormality, atraumatic  Eyes:    PERRL, conjunctiva/corneas clear, EOM's intact, fundi benign  Ears:    Normal TM's and external ear canals  Nose:   No drainage or sinus tenderness  Throat:   Normal mucosa  Neck:   Supple, no lymphadenopathy;  thyroid:  no enlargement/ tenderness/nodules; no carotid bruit or JVD  Back:    Spine nontender, no curvature, ROM normal, no CVA tenderness  Lungs:     Clear to auscultation bilaterally without wheezes, rales or ronchi; respirations unlabored  Chest Wall:    No tenderness or deformity   Heart:    Regular rate and rhythm, S1 and S2 normal, no murmur, rub or gallop  Breast Exam:    normal appearance--no skin dimpling, nipple inversion, nipple discharge, palpable masses or axillary lymphadenopathy.   Abdomen:     Soft, non-tender, nondistended, normoactive bowel sounds, no masses, no hepatosplenomegaly  Genitalia:    normal external genitalia without lesions.  BUS and vagina normal; no abnormal discharge. IUD strings are visualized. Cervix appears normal, no lesions or abnormal discharge. Pap not obtained.  Rectal:   deferred to GYN  Extremities:  No clubbing, cyanosis or edema  Pulses:   2+ and symmetric all extremities  Skin:   Skin color, texture, turgor normal, no rashes or lesions.    Lymph nodes:   Cervical, supraclavicular, and axillary nodes normal  Neurologic:   CNII-XII intact, normal strength, sensation and  gait; reflexes 2+ and symmetric throughout                Psych:  Normal mood, affect, hygiene, grooming, eye contact, speech   ASSESSMENT/PLAN:  Forms for surgical clearance  Pretty sure we got her GYN records, not yet abstracted. ?pap and mammo done 03/2021?? Cologuard or colonoscopy?  IUD removal today Flu and COVID booster today. [place] shingrix 2 wks  FSH, TSH, c-met, cbc, lipids, HIV, Hep C, vit D   Discussed monthly self breast exams and yearly mammograms; at least 30 minutes of aerobic activity at least 5 days/week, weight-bearing exercises 2x/week; proper sunscreen use reviewed; healthy diet, including goals of calcium and vitamin D intake and alcohol recommendations (less than or equal to 1 drink/day) reviewed; regular seatbelt use; changing batteries in smoke detectors.  Immunization recommendations discussed-- yearly flu shots are recommended.   Shingrix is recommended (risks/SE reviewed).  Colonoscopy recommendations reviewed, past due. ***   5 wk f/u on moods

## 2022-01-24 NOTE — Patient Instructions (Incomplete)
  HEALTH MAINTENANCE RECOMMENDATIONS:  It is recommended that you get at least 30 minutes of aerobic exercise at least 5 days/week (for weight loss, you may need as much as 60-90 minutes). This can be any activity that gets your heart rate up. This can be divided in 10-15 minute intervals if needed, but try and build up your endurance at least once a week.  Weight bearing exercise is also recommended twice weekly.  Eat a healthy diet with lots of vegetables, fruits and fiber.  "Colorful" foods have a lot of vitamins (ie green vegetables, tomatoes, red peppers, etc).  Limit sweet tea, regular sodas and alcoholic beverages, all of which has a lot of calories and sugar.  Up to 1 alcoholic drink daily may be beneficial for women (unless trying to lose weight, watch sugars).  Drink a lot of water.  Calcium recommendations are 1200-1500 mg daily (1500 mg for postmenopausal women or women without ovaries), and vitamin D 1000 IU daily.  This should be obtained from diet and/or supplements (vitamins), and calcium should not be taken all at once, but in divided doses.  Monthly self breast exams and yearly mammograms for women over the age of 41 is recommended.  Sunscreen of at least SPF 30 should be used on all sun-exposed parts of the skin when outside between the hours of 10 am and 4 pm (not just when at beach or pool, but even with exercise, golf, tennis, and yard work!)  Use a sunscreen that says "broad spectrum" so it covers both UVA and UVB rays, and make sure to reapply every 1-2 hours.  Remember to change the batteries in your smoke detectors when changing your clock times in the spring and fall. Carbon monoxide detectors are recommended for your home.  Use your seat belt every time you are in a car, and please drive safely and not be distracted with cell phones and texting while driving.  I recommend getting the new shingles vaccine (Shingrix). You may want to check with your insurance to verify what  your out of pocket cost may be (usually covered as preventative, but better to verify to avoid any surprises, as this vaccine is expensive), and then schedule a nurse visit at our office when convenient (based on the possible side effects as discussed).   This is a series of 2 injections, spaced 2 months apart.  It doesn't have to be exactly 2 months apart (but can't be sooner), if that isn't feasible for your schedule, but try and get them close to 2 months (and definitely within 6 months of each other, or else the efficacy of the vaccine drops off). This should be separated from other vaccines by at least 2 weeks.  Please schedule a routine eye exam.  I'm not sure that you truly need a lot of the vitamins that you're taking.  You get many from the multivitamin. B12, Potassium, pyridoxine are ones that you may not need. Your potassium and magnesium will be checked today. Read the omega-3 label to see the max you are supposed to be taking (typically I recommend 3000-'4000mg'$  daily).

## 2022-01-25 ENCOUNTER — Encounter: Payer: Self-pay | Admitting: Family Medicine

## 2022-01-25 ENCOUNTER — Ambulatory Visit (INDEPENDENT_AMBULATORY_CARE_PROVIDER_SITE_OTHER): Payer: Managed Care, Other (non HMO) | Admitting: Family Medicine

## 2022-01-25 VITALS — BP 112/74 | HR 68 | Ht 65.5 in | Wt 200.4 lb

## 2022-01-25 DIAGNOSIS — Z30432 Encounter for removal of intrauterine contraceptive device: Secondary | ICD-10-CM | POA: Diagnosis not present

## 2022-01-25 DIAGNOSIS — N951 Menopausal and female climacteric states: Secondary | ICD-10-CM | POA: Diagnosis not present

## 2022-01-25 DIAGNOSIS — Z1211 Encounter for screening for malignant neoplasm of colon: Secondary | ICD-10-CM

## 2022-01-25 DIAGNOSIS — Z23 Encounter for immunization: Secondary | ICD-10-CM | POA: Diagnosis not present

## 2022-01-25 DIAGNOSIS — F332 Major depressive disorder, recurrent severe without psychotic features: Secondary | ICD-10-CM

## 2022-01-25 DIAGNOSIS — Z0000 Encounter for general adult medical examination without abnormal findings: Secondary | ICD-10-CM | POA: Diagnosis not present

## 2022-01-25 DIAGNOSIS — Z975 Presence of (intrauterine) contraceptive device: Secondary | ICD-10-CM

## 2022-01-25 DIAGNOSIS — M1611 Unilateral primary osteoarthritis, right hip: Secondary | ICD-10-CM

## 2022-01-25 DIAGNOSIS — Z113 Encounter for screening for infections with a predominantly sexual mode of transmission: Secondary | ICD-10-CM

## 2022-01-25 LAB — POCT URINALYSIS DIP (PROADVANTAGE DEVICE)
Bilirubin, UA: NEGATIVE
Blood, UA: NEGATIVE
Glucose, UA: NEGATIVE mg/dL
Leukocytes, UA: NEGATIVE
Nitrite, UA: NEGATIVE
Protein Ur, POC: NEGATIVE mg/dL
Specific Gravity, Urine: 1.015
Urobilinogen, Ur: 0.2
pH, UA: 6.5 (ref 5.0–8.0)

## 2022-01-26 ENCOUNTER — Encounter: Payer: Self-pay | Admitting: Family Medicine

## 2022-01-26 LAB — CBC WITH DIFFERENTIAL/PLATELET
Basophils Absolute: 0.1 10*3/uL (ref 0.0–0.2)
Basos: 1 %
EOS (ABSOLUTE): 0.3 10*3/uL (ref 0.0–0.4)
Eos: 5 %
Hematocrit: 43.2 % (ref 34.0–46.6)
Hemoglobin: 14.7 g/dL (ref 11.1–15.9)
Immature Grans (Abs): 0 10*3/uL (ref 0.0–0.1)
Immature Granulocytes: 1 %
Lymphocytes Absolute: 1.5 10*3/uL (ref 0.7–3.1)
Lymphs: 28 %
MCH: 31.1 pg (ref 26.6–33.0)
MCHC: 34 g/dL (ref 31.5–35.7)
MCV: 91 fL (ref 79–97)
Monocytes Absolute: 0.4 10*3/uL (ref 0.1–0.9)
Monocytes: 8 %
Neutrophils Absolute: 2.9 10*3/uL (ref 1.4–7.0)
Neutrophils: 57 %
Platelets: 271 10*3/uL (ref 150–450)
RBC: 4.73 x10E6/uL (ref 3.77–5.28)
RDW: 11.8 % (ref 11.7–15.4)
WBC: 5.1 10*3/uL (ref 3.4–10.8)

## 2022-01-26 LAB — COMPREHENSIVE METABOLIC PANEL
ALT: 24 IU/L (ref 0–32)
AST: 24 IU/L (ref 0–40)
Albumin/Globulin Ratio: 2 (ref 1.2–2.2)
Albumin: 4.7 g/dL (ref 3.8–4.9)
Alkaline Phosphatase: 67 IU/L (ref 44–121)
BUN/Creatinine Ratio: 13 (ref 9–23)
BUN: 11 mg/dL (ref 6–24)
Bilirubin Total: 0.3 mg/dL (ref 0.0–1.2)
CO2: 24 mmol/L (ref 20–29)
Calcium: 10.1 mg/dL (ref 8.7–10.2)
Chloride: 102 mmol/L (ref 96–106)
Creatinine, Ser: 0.88 mg/dL (ref 0.57–1.00)
Globulin, Total: 2.4 g/dL (ref 1.5–4.5)
Glucose: 111 mg/dL — ABNORMAL HIGH (ref 70–99)
Potassium: 3.9 mmol/L (ref 3.5–5.2)
Sodium: 145 mmol/L — ABNORMAL HIGH (ref 134–144)
Total Protein: 7.1 g/dL (ref 6.0–8.5)
eGFR: 78 mL/min/{1.73_m2} (ref >59)

## 2022-01-26 LAB — LIPID PANEL
Chol/HDL Ratio: 2.2 ratio (ref 0.0–4.4)
Cholesterol, Total: 207 mg/dL — ABNORMAL HIGH (ref 100–199)
HDL: 96 mg/dL (ref >39)
LDL Chol Calc (NIH): 92 mg/dL (ref 0–99)
Triglycerides: 112 mg/dL (ref 0–149)
VLDL Cholesterol Cal: 19 mg/dL (ref 5–40)

## 2022-01-26 LAB — RPR: RPR Ser Ql: NONREACTIVE

## 2022-01-26 LAB — FOLLICLE STIMULATING HORMONE: FSH: 81.7 m[IU]/mL

## 2022-01-26 LAB — HIV ANTIBODY (ROUTINE TESTING W REFLEX): HIV Screen 4th Generation wRfx: NONREACTIVE

## 2022-01-26 LAB — HEPATITIS B SURFACE ANTIGEN: Hepatitis B Surface Ag: NEGATIVE

## 2022-01-26 LAB — TSH: TSH: 1.55 u[IU]/mL (ref 0.450–4.500)

## 2022-01-26 LAB — VITAMIN D 25 HYDROXY (VIT D DEFICIENCY, FRACTURES): Vit D, 25-Hydroxy: 50.2 ng/mL (ref 30.0–100.0)

## 2022-01-26 LAB — HEPATITIS C ANTIBODY: Hep C Virus Ab: NONREACTIVE

## 2022-01-28 LAB — GC/CHLAMYDIA PROBE AMP
Chlamydia trachomatis, NAA: NEGATIVE
Neisseria Gonorrhoeae by PCR: NEGATIVE

## 2022-02-10 ENCOUNTER — Encounter: Payer: Self-pay | Admitting: *Deleted

## 2022-02-19 HISTORY — PX: HIP SURGERY: SHX245

## 2022-02-28 NOTE — Progress Notes (Unsigned)
No chief complaint on file.  Patient presents for 6 week follow-up on Depression. Her wellbutrin dose was increased to '450mg'$  on 11/1 (PHQ-9 score was 15), and at the time of her physical 4 weeks ago she had already noted some improvement. She denies any side effects. Sleep and moods have improved  Impaired fasting glucose noted with recent labs, glucose 111. Other labs were fine.  [place] elevated in postmenopausal range. She had IUD removed at her physical. She denies any bleeding.  Since last seen, she underwent R total hip replacement   PMH, PSH, SH reviewed   ROS: no fever, chills, URI symptoms, headaches, dizziness, chest pain, shortness of breath. No n/v/d  R hip pain   ASSESSMENT/PLAN:  Enter hip replacement (confirm total hip) under surgical history. Phq-9  Shingrix today?  Schedule CPE for next year

## 2022-03-01 ENCOUNTER — Encounter: Payer: Self-pay | Admitting: Family Medicine

## 2022-03-01 ENCOUNTER — Encounter: Payer: Self-pay | Admitting: *Deleted

## 2022-03-01 ENCOUNTER — Ambulatory Visit: Payer: Managed Care, Other (non HMO) | Admitting: Family Medicine

## 2022-03-01 VITALS — BP 110/60 | HR 60 | Ht 66.5 in | Wt 206.4 lb

## 2022-03-01 DIAGNOSIS — Z6832 Body mass index (BMI) 32.0-32.9, adult: Secondary | ICD-10-CM | POA: Diagnosis not present

## 2022-03-01 DIAGNOSIS — F332 Major depressive disorder, recurrent severe without psychotic features: Secondary | ICD-10-CM

## 2022-03-01 DIAGNOSIS — Z96641 Presence of right artificial hip joint: Secondary | ICD-10-CM | POA: Diagnosis not present

## 2022-03-01 MED ORDER — PHENTERMINE HCL 37.5 MG PO CAPS: 37.5000 mg | ORAL_CAPSULE | ORAL | 1 refills | Status: DC

## 2022-03-01 NOTE — Patient Instructions (Signed)
Schedule a nurse visit at your convenience to start your Shingles vaccine. The 2nd dose is 2 months after the first (schedule the 2nd nurse visit when you come for the first shot).

## 2022-04-19 ENCOUNTER — Encounter: Payer: Self-pay | Admitting: Family Medicine

## 2022-04-19 NOTE — Telephone Encounter (Signed)
I think you meant 03/2021, and pap every  years? That is what is recommended. You can let her know that pap isn't needed until 03/2024, but she will have yearly pelvic exams (and we can discuss the pap smear recommendations more at her visit).  She should be able to schedule her mammogram with the breast center (a year after her last one)).

## 2022-05-02 NOTE — Progress Notes (Unsigned)
No chief complaint on file.  Patient presents to follow-up on her weight.  She had gained weight, and was noted to have impaired fasting glucose (glu 111).  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 At that visit we discussed her diet in detail--she was encouraged to cut back on egg intake, eliminate processed foods, cut back on red meat, incorporate more whole grains, higher fiber diet, and legumes/nuts/seeds.  We discussed cutting back on carbs, but not increasing fats in her diet. She was given phentermine prescription to help jumpstart her weight loss efforts.  She was to take it daily for the first month, to get through the holidays, and then try and cut back and use it prn for more stressful occasions (ie events, parties).  She has done very well s/p hip replacement. Current exercise   Depression:  She continues to do well on Wellbutrin XL 468m daily. She denies side effects. She continues to see therapist.    [place]   ROS:   PHYSICAL EXAM:  There were no vitals taken for this visit.  Wt Readings from Last 3 Encounters:  03/01/22 206 lb 6.4 oz (93.6 kg)  01/25/22 200 lb 6.4 oz (90.9 kg)  01/13/22 201 lb (91.2 kg)      ASSESSMENT/PLAN:   Offer Shingrix Did she ever get the Cologuard?

## 2022-05-03 ENCOUNTER — Ambulatory Visit: Payer: Managed Care, Other (non HMO) | Admitting: Family Medicine

## 2022-05-05 ENCOUNTER — Ambulatory Visit: Payer: Managed Care, Other (non HMO) | Admitting: Family Medicine

## 2022-05-05 ENCOUNTER — Encounter: Payer: Self-pay | Admitting: Family Medicine

## 2022-05-05 VITALS — BP 108/68 | HR 72 | Ht 66.5 in | Wt 192.0 lb

## 2022-05-05 DIAGNOSIS — R7301 Impaired fasting glucose: Secondary | ICD-10-CM

## 2022-05-05 DIAGNOSIS — Z6830 Body mass index (BMI) 30.0-30.9, adult: Secondary | ICD-10-CM | POA: Diagnosis not present

## 2022-05-05 DIAGNOSIS — F339 Major depressive disorder, recurrent, unspecified: Secondary | ICD-10-CM | POA: Diagnosis not present

## 2022-05-19 ENCOUNTER — Telehealth: Payer: Self-pay | Admitting: Family Medicine

## 2022-05-19 NOTE — Telephone Encounter (Signed)
Pt states she called to schedule a mammogram with [facility] and was advised that orders for a diagnostic mammogram needed to be placed with [facility]. Pt can be reached at [phone].

## 2022-05-19 NOTE — Telephone Encounter (Signed)
I called patient and when she went to call and schedule her routine screening, they ask their screening questions and she let them know that her L nipple has been itching her x days and she has a rash on underside of L breast (where underwire is). They said she would need a diagnostic mammogram now. Can you order or would you like her to be seen?

## 2022-05-19 NOTE — Telephone Encounter (Signed)
[facility] for diagnostic

## 2022-05-20 ENCOUNTER — Encounter: Payer: Self-pay | Admitting: Family Medicine

## 2022-05-22 ENCOUNTER — Telehealth: Payer: Managed Care, Other (non HMO) | Admitting: Nurse Practitioner

## 2022-05-22 ENCOUNTER — Encounter: Payer: Self-pay | Admitting: Family Medicine

## 2022-05-22 DIAGNOSIS — B029 Zoster without complications: Secondary | ICD-10-CM | POA: Diagnosis not present

## 2022-05-22 MED ORDER — VALACYCLOVIR HCL 1 G PO TABS: 1000.0000 mg | ORAL_TABLET | Freq: Three times a day (TID) | ORAL | 0 refills | Status: AC

## 2022-05-22 MED ORDER — GABAPENTIN 300 MG PO CAPS: 300.0000 mg | ORAL_CAPSULE | Freq: Two times a day (BID) | ORAL | 1 refills | Status: DC

## 2022-05-22 NOTE — Patient Instructions (Signed)
Marcheta Grammes, thank you for joining Chevis Pretty, FNP for today's virtual visit.  While this provider is not your primary care provider (PCP), if your PCP is located in our provider database this encounter information will be shared with them immediately following your visit.   [facility] account gives you access to today's visit and all your visits, tests, and labs performed at [facility] " click here if you don't have a [facility] account or go to [email or web address]  Consent: (Patient) Priscilla Bautista provided verbal consent for this virtual visit at the beginning of the encounter.  Current Medications:  Current Outpatient Medications:    gabapentin (NEURONTIN) 300 MG capsule, Take 1 capsule (300 mg total) by mouth 2 (two) times daily., Disp: 30 capsule, Rfl: 1   valACYclovir (VALTREX) 1000 MG tablet, Take 1 tablet (1,000 mg total) by mouth 3 (three) times daily for 7 days., Disp: 21 tablet, Rfl: 0   buPROPion (WELLBUTRIN XL) 150 MG 24 hr tablet, Take 3 tablets (450 mg total) by mouth daily., Disp: 90 tablet, Rfl: 1   cholecalciferol (VITAMIN D3) 25 MCG (1000 UT) tablet, Take 1,000 Units by mouth daily., Disp: , Rfl:    Magnesium 500 MG CAPS, Take 1,000 mg by mouth at bedtime., Disp: , Rfl:    Misc Natural Products (OSTEO BI-FLEX ADV TRIPLE ST PO), Take 1 tablet by mouth daily. (Patient not taking: Reported on 03/01/2022), Disp: , Rfl:    Multiple Vitamin (MULTIVITAMIN) tablet, Take 1 tablet by mouth daily., Disp: , Rfl:    Omega-3 Fatty Acids (FISH OIL) 1000 MG CAPS, Take 6,000 mg by mouth daily., Disp: , Rfl:    phentermine 37.5 MG capsule, Take 1 capsule (37.5 mg total) by mouth every morning., Disp: 30 capsule, Rfl: 1   Probiotic Product (PROBIOTIC DAILY PO), Take 1 capsule by mouth daily. Reported on 04/07/2015 (Patient not taking: Reported on 03/01/2022), Disp: , Rfl:    Pyridoxine HCl (VITAMIN B-6) 500 MG tablet, Take 500 mg by mouth  daily., Disp: , Rfl:    vitamin B-12 (CYANOCOBALAMIN) 500 MCG tablet, Take 500 mcg by mouth every morning. Reported on 04/07/2015, Disp: , Rfl:    Medications ordered in this encounter:  Meds ordered this encounter  Medications   valACYclovir (VALTREX) 1000 MG tablet    Sig: Take 1 tablet (1,000 mg total) by mouth 3 (three) times daily for 7 days.    Dispense:  21 tablet    Refill:  0    Order Specific Question:   Supervising Provider    Answer:   Chase Picket [id number]   gabapentin (NEURONTIN) 300 MG capsule    Sig: Take 1 capsule (300 mg total) by mouth 2 (two) times daily.    Dispense:  30 capsule    Refill:  1    Order Specific Question:   Supervising Provider    Answer:   Chase Picket [id number]     *If you need refills on other medications prior to your next appointment, please contact your pharmacy*  Follow-Up: Call back or seek an in-person evaluation if the symptoms worsen or if the condition fails to improve as anticipated.  [place] [phone]  Other Instructions Avoid rubbing or scratching   If you have been instructed to have an in-person evaluation today at a local Urgent Care facility, please use the link below. It will take you to a list of all of our available [facility]  Urgent Cares, including address, phone number and hours of operation. Please do not delay care.  [facility]  If you or a family member do not have a primary care provider, use the link below to schedule a visit and establish care. When you choose a [facility] primary care physician or advanced practice provider, you gain a long-term partner in health. Find a Primary Care Provider  Learn more about [facility]'s in-office and virtual care options: [facility]

## 2022-05-22 NOTE — Progress Notes (Signed)
Virtual Visit Consent   Priscilla Bautista, you are scheduled for a virtual visit with Mary-Margaret Hassell Done, [place], a [facility] provider, today.     Just as with appointments in the office, your consent must be obtained to participate.  Your consent will be active for this visit and any virtual visit you may have with one of our providers in the next 365 days.     If you have a MyChart account, a copy of this consent can be sent to you electronically.  All virtual visits are billed to your insurance company just like a traditional visit in the office.    As this is a virtual visit, video technology does not allow for your provider to perform a traditional examination.  This may limit your provider's ability to fully assess your condition.  If your provider identifies any concerns that need to be evaluated in person or the need to arrange testing (such as labs, EKG, etc.), we will make arrangements to do so.     Although advances in technology are sophisticated, we cannot ensure that it will always work on either your end or our end.  If the connection with a video visit is poor, the visit may have to be switched to a telephone visit.  With either a video or telephone visit, we are not always able to ensure that we have a secure connection.     I need to obtain your verbal consent now.   Are you willing to proceed with your visit today? YES   Reneshia Phouthavong Boback has provided verbal consent on 05/22/2022 for a virtual visit (video or telephone).   Mary-Margaret Hassell Done, FNP   Date: 05/22/2022 1:47 PM   Virtual Visit via Video Note   I, Mary-Margaret Ollie Esty, connected with BRANDON MACKIEWICZ ([id number], Dec 20, 1965) on 05/22/22 at  2:00 PM EST by a video-enabled telemedicine application and verified that I am speaking with the correct person using two identifiers.  Location: Patient: Virtual Visit Location Patient: Home Provider: Virtual Visit Location Provider: Mobile   I discussed the limitations of  evaluation and management by telemedicine and the availability of in person appointments. The patient expressed understanding and agreed to proceed.    History of Present Illness: Priscilla Bautista is a 57 y.o. who identifies as a female who was assigned female at birth, and is being seen today for rash.  HPI: Patient says on Tuesday she noticed she had an itching sensation on back. Moved around to left breast. Found rash on Thursday on left breast. [place] has continued and now it itches and is very painful.    ROS  Problems:  Patient Active Problem List   Diagnosis Date Noted   Osteoarthritis of right hip 01/14/2022   Abnormal finding on CT scan 12/26/2013   Anemia, iron deficiency 10/05/2013   Depression, major, in remission ([place]) 10/04/2013   Allergic rhinitis 10/04/2013    Allergies: No Known Allergies Medications:  Current Outpatient Medications:    buPROPion (WELLBUTRIN XL) 150 MG 24 hr tablet, Take 3 tablets (450 mg total) by mouth daily., Disp: 90 tablet, Rfl: 1   cholecalciferol (VITAMIN D3) 25 MCG (1000 UT) tablet, Take 1,000 Units by mouth daily., Disp: , Rfl:    Magnesium 500 MG CAPS, Take 1,000 mg by mouth at bedtime., Disp: , Rfl:    Misc Natural Products (OSTEO BI-FLEX ADV TRIPLE ST PO), Take 1 tablet by mouth daily. (Patient not taking: Reported on 03/01/2022), Disp: , Rfl:  Multiple Vitamin (MULTIVITAMIN) tablet, Take 1 tablet by mouth daily., Disp: , Rfl:    Omega-3 Fatty Acids (FISH OIL) 1000 MG CAPS, Take 6,000 mg by mouth daily., Disp: , Rfl:    phentermine 37.5 MG capsule, Take 1 capsule (37.5 mg total) by mouth every morning., Disp: 30 capsule, Rfl: 1   Probiotic Product (PROBIOTIC DAILY PO), Take 1 capsule by mouth daily. Reported on 04/07/2015 (Patient not taking: Reported on 03/01/2022), Disp: , Rfl:    Pyridoxine HCl (VITAMIN B-6) 500 MG tablet, Take 500 mg by mouth daily., Disp: , Rfl:    vitamin B-12 (CYANOCOBALAMIN) 500 MCG tablet, Take 500 mcg by mouth every  morning. Reported on 04/07/2015, Disp: , Rfl:   Observations/Objective: Patient is well-developed, well-nourished in no acute distress.  Resting comfortably  at home.  Head is normocephalic, atraumatic.  No labored breathing.  Speech is clear and coherent with logical content.  Patient is alert and oriented at baseline.  Vesicular lesions on left breast tat spread around left flank  Assessment and Plan:  Graylon Gunning Bury in today with chief complaint of Rash   1. Herpes zoster without complication Avoid rubbing or scratching Keep covered  Meds ordered this encounter  Medications   valACYclovir (VALTREX) 1000 MG tablet    Sig: Take 1 tablet (1,000 mg total) by mouth 3 (three) times daily for 7 days.    Dispense:  21 tablet    Refill:  0    Order Specific Question:   Supervising Provider    Answer:   Chase Picket [id number]   gabapentin (NEURONTIN) 300 MG capsule    Sig: Take 1 capsule (300 mg total) by mouth 2 (two) times daily.    Dispense:  30 capsule    Refill:  1    Order Specific Question:   Supervising Provider    Answer:   Chase Picket [id number]     Follow Up Instructions: I discussed the assessment and treatment plan with the patient. The patient was provided an opportunity to ask questions and all were answered. The patient agreed with the plan and demonstrated an understanding of the instructions.  A copy of instructions were sent to the patient via MyChart.  The patient was advised to call back or seek an in-person evaluation if the symptoms worsen or if the condition fails to improve as anticipated.  Time:  I spent 11 minutes with the patient via telehealth technology discussing the above problems/concerns.    Mary-Margaret Hassell Done, FNP

## 2022-06-11 LAB — HM MAMMOGRAPHY

## 2022-06-16 ENCOUNTER — Encounter: Payer: Self-pay | Admitting: *Deleted

## 2022-06-21 ENCOUNTER — Other Ambulatory Visit: Payer: Self-pay | Admitting: Family Medicine

## 2022-06-21 DIAGNOSIS — Z6832 Body mass index (BMI) 32.0-32.9, adult: Secondary | ICD-10-CM

## 2022-06-21 NOTE — Telephone Encounter (Signed)
Is this okay to refill? 

## 2022-06-22 ENCOUNTER — Other Ambulatory Visit: Payer: Self-pay | Admitting: Family Medicine

## 2022-06-22 ENCOUNTER — Encounter: Payer: Self-pay | Admitting: Family Medicine

## 2022-06-22 DIAGNOSIS — Z6832 Body mass index (BMI) 32.0-32.9, adult: Secondary | ICD-10-CM

## 2022-07-21 ENCOUNTER — Other Ambulatory Visit: Payer: Self-pay | Admitting: *Deleted

## 2022-07-21 DIAGNOSIS — F332 Major depressive disorder, recurrent severe without psychotic features: Secondary | ICD-10-CM

## 2022-07-21 MED ORDER — BUPROPION HCL ER (XL) 150 MG PO TB24
450.0000 mg | ORAL_TABLET | Freq: Every day | ORAL | 1 refills | Status: DC
Start: 2022-07-21 — End: 2022-07-22

## 2022-07-22 ENCOUNTER — Encounter: Payer: Self-pay | Admitting: *Deleted

## 2022-07-22 ENCOUNTER — Other Ambulatory Visit: Payer: Self-pay | Admitting: *Deleted

## 2022-07-22 DIAGNOSIS — F332 Major depressive disorder, recurrent severe without psychotic features: Secondary | ICD-10-CM

## 2022-07-22 MED ORDER — BUPROPION HCL ER (XL) 150 MG PO TB24
450.0000 mg | ORAL_TABLET | Freq: Every day | ORAL | 1 refills | Status: DC
Start: 2022-07-22 — End: 2023-01-25

## 2022-08-17 MED ORDER — PHENTERMINE HCL 37.5 MG PO CAPS
ORAL_CAPSULE | ORAL | 0 refills | Status: DC
Start: 2022-08-17 — End: 2022-10-06

## 2022-10-06 ENCOUNTER — Encounter: Payer: Self-pay | Admitting: Family Medicine

## 2022-10-06 ENCOUNTER — Ambulatory Visit: Payer: Managed Care, Other (non HMO) | Admitting: Family Medicine

## 2022-10-06 VITALS — BP 128/80 | HR 80 | Temp 98.0°F | Ht 65.5 in | Wt 180.0 lb

## 2022-10-06 DIAGNOSIS — Z6832 Body mass index (BMI) 32.0-32.9, adult: Secondary | ICD-10-CM

## 2022-10-06 DIAGNOSIS — R350 Frequency of micturition: Secondary | ICD-10-CM | POA: Diagnosis not present

## 2022-10-06 DIAGNOSIS — R300 Dysuria: Secondary | ICD-10-CM

## 2022-10-06 DIAGNOSIS — M545 Low back pain, unspecified: Secondary | ICD-10-CM | POA: Diagnosis not present

## 2022-10-06 DIAGNOSIS — N3001 Acute cystitis with hematuria: Secondary | ICD-10-CM

## 2022-10-06 LAB — POCT URINALYSIS DIP (PROADVANTAGE DEVICE)
Bilirubin, UA: NEGATIVE
Glucose, UA: NEGATIVE mg/dL
Ketones, POC UA: NEGATIVE mg/dL
Nitrite, UA: NEGATIVE
Protein Ur, POC: NEGATIVE mg/dL
Specific Gravity, Urine: 1.01
Urobilinogen, Ur: 0.2
pH, UA: 6.5 (ref 5.0–8.0)

## 2022-10-06 MED ORDER — PHENTERMINE HCL 37.5 MG PO CAPS
ORAL_CAPSULE | ORAL | 0 refills | Status: DC
Start: 2022-10-06 — End: 2023-02-04

## 2022-10-06 MED ORDER — NITROFURANTOIN MONOHYD MACRO 100 MG PO CAPS
100.0000 mg | ORAL_CAPSULE | Freq: Two times a day (BID) | ORAL | 0 refills | Status: DC
Start: 2022-10-06 — End: 2023-01-26

## 2022-10-06 NOTE — Progress Notes (Signed)
Chief Complaint  Patient presents with   Urinary Tract Infection    Symptoms: frequent urine, burning with urination. Took an OTC rx in powder form for pain relief, does not know name, taken from Saturday to yesterday morning. Patient would also like refill of phentermine if you would while she is here.   Symptoms started 7/19 while in [place]. Started AZO on 7/20. She reports it is a powder version (got from [place]). Flew back yesterday. Noted blood in urine 7/22, comes and goes, unsure if blood or color from the powdered AZO. Did notice blood with wiping once.  She has suprapubic pressure. +urgency, frequency, dysuria.  No f/c, flank pain. Slight soreness in low back.  She is requesting refill of phentermine. Last phentermine was RF 6/4 #30. Took last pill today.  Leaving for [place] in 11 days for her daughter's wedding. Dress is a little snug, hoping to get down another few pounds before then. Didn't take any while in [place]. Needs a little extra help until then.   PMH, PSH, SH reviewed  Outpatient Encounter Medications as of 10/06/2022  Medication Sig Note   buPROPion (WELLBUTRIN XL) 150 MG 24 hr tablet Take 3 tablets (450 mg total) by mouth daily.    cholecalciferol (VITAMIN D3) 25 MCG (1000 UT) tablet Take 1,000 Units by mouth daily.    Magnesium 500 MG CAPS Take 1,000 mg by mouth at bedtime.    Multiple Vitamin (MULTIVITAMIN) tablet Take 1 tablet by mouth daily. 01/25/2022: gummy   Omega-3 Fatty Acids (FISH OIL) 1000 MG CAPS Take 6,000 mg by mouth daily.    phentermine 37.5 MG capsule TAKE 1 CAPSULE(37.5 MG) BY MOUTH EVERY MORNING 10/06/2022: Last dose this morning   Probiotic Product (PROBIOTIC DAILY PO) Take 1 capsule by mouth daily. Reported on 04/07/2015    Pyridoxine HCl (VITAMIN B-6) 500 MG tablet Take 500 mg by mouth daily.    vitamin B-12 (CYANOCOBALAMIN) 500 MCG tablet Take 500 mcg by mouth every morning. Reported on 04/07/2015    [DISCONTINUED] gabapentin (NEURONTIN) 300  MG capsule Take 1 capsule (300 mg total) by mouth 2 (two) times daily.    [DISCONTINUED] Misc Natural Products (OSTEO BI-FLEX ADV TRIPLE ST PO) Take 1 tablet by mouth daily. (Patient not taking: Reported on 03/01/2022) 05/05/2022: As needed   No facility-administered encounter medications on file as of 10/06/2022.   No Known Allergies  ROS: no fever, chills, URI symptoms.  Urinary complaints per HPI.  No n/v/d No chest pain, shortness of breath, edema No bleeding, bruising, rash   PHYSICAL EXAM:  BP 128/80   Pulse 80   Temp 98 F (36.7 C)   Ht 5' 5.5" (1.664 m)   Wt 180 lb (81.6 kg)   BMI 29.50 kg/m   Wt Readings from Last 3 Encounters:  10/06/22 180 lb (81.6 kg)  05/05/22 192 lb (87.1 kg)  03/01/22 206 lb 6.4 oz (93.6 kg)   Well-appearing female, in good spirits HEENT: conjunctiva and sclera are clear, EOMI Heart: regular rate and rhythm Lungs: clear bilaterally Back: no spinal or CVA tenderness Abdomen: tender in suprapubic region, nontender elsewhere.  Urine: Mod blood, large leuks    ASSESSMENT/PLAN:  Acute cystitis with hematuria - urine sent for culture. To contact us if sx don't resolve in 2-3d. To stop AZO. Stay well hydrated, f/u if persists/worsens, fever, flank pain, n/v - Plan: nitrofurantoin, macrocrystal-monohydrate, (MACROBID) 100 MG capsule, Urine Culture  Urinary frequency - Plan: POCT Urinalysis DIP (Proadvantage Device)  Burning  with urination - Plan: POCT Urinalysis DIP (Proadvantage Device)  Low back pain, unspecified back pain laterality, unspecified chronicity, unspecified whether sciatica present - Plan: POCT Urinalysis DIP (Proadvantage Device)  BMI 32.0-32.9,adult - BMI is now down to 29.5.  Has done well with phentermine.  Understands not for longterm use. Refilled for the last time (due to upcoming dtr's wedding) - Plan: phentermine 37.5 MG capsule  Discussed phentermine--will not refill going forward, last rx She could continue to spread  out use (use prn when in settings that she needs to manage her appetite--ie parties, holidays).

## 2022-10-13 ENCOUNTER — Telehealth: Payer: Self-pay | Admitting: *Deleted

## 2022-10-13 NOTE — Telephone Encounter (Signed)
Called patient to follow up on urine culture results. Left message letting her know that her infection should jabe been treated by the prescribed abx and to please give [place] a call (or MyChart message) if her symptoms don't completely resolve.

## 2022-10-16 ENCOUNTER — Encounter: Payer: Self-pay | Admitting: Family Medicine

## 2022-12-30 ENCOUNTER — Encounter: Payer: Managed Care, Other (non HMO) | Admitting: Family Medicine

## 2023-01-09 ENCOUNTER — Other Ambulatory Visit: Payer: Self-pay | Admitting: Family Medicine

## 2023-01-09 DIAGNOSIS — F332 Major depressive disorder, recurrent severe without psychotic features: Secondary | ICD-10-CM

## 2023-01-24 ENCOUNTER — Encounter: Payer: Self-pay | Admitting: *Deleted

## 2023-01-25 NOTE — Progress Notes (Unsigned)
No chief complaint on file.   Priscilla Bautista is a 57 y.o. female who presents for a complete physical.   Last seen here in August with UTI, which recurred shortly after.   Depression:  Wellbutrin dose was increased on 01/2022 to 450mg  daily (declined adding SSRI), and she has done very well on this dose.   Impaired fasting glucose: glucose elevated at 111 at CPE 01/2022 (when BMI 32).   Obesity:  BMI was 32 in 02/2022.  She used some phentermine and worked hard on her diet (used [employer], dispensing) and was able to lose weight--lost 26# from 02/2022 to 09/2022.  Her daughter got married in [place] in August.  UPDATE  [place] Readings from Last 3 Encounters:  10/06/22 180 lb (81.6 kg)  05/05/22 192 lb (87.1 kg)  03/01/22 206 lb 6.4 oz (93.6 kg)     Immunization History  Administered Date(s) Administered   Influenza,inj,Quad PF,6+ Mos 12/01/2015, 12/15/2016, 03/09/2018, 01/25/2022   PFIZER Comirnaty(Gray Top)Covid-19 Tri-Sucrose Vaccine 06/26/2019, 07/20/2019, 08/15/2020   PFIZER(Purple Top)SARS-COV-2 Vaccination 06/26/2019   Pfizer(Comirnaty)Fall Seasonal Vaccine 12 years and older 01/25/2022   Tdap 10/04/2013   Last Pap smear: 03/2021 normal. Last mammogram: 05/2022 at [facility] Last colonoscopy: never. Previously got Cologuard, never returned it. Last DEXA: never Dentist: once yearly Ophtho: many years Exercise:     Diet: Has dairy (cheese daily, no yogurt, almondmilk in coffee). Only very sporadic with calcium supplements (Tums).  Takes MVI daily. Lipid screen: Lab Results  Component Value Date   CHOL 207 (H) 01/25/2022   HDL 96 01/25/2022   LDLCALC 92 01/25/2022   TRIG 112 01/25/2022   CHOLHDL 2.2 01/25/2022    Vitamin D screen 50.2 in 01/2022   PMH, PSH, SH and FH reviewed     ROS:  The patient denies anorexia, fever, headaches,  vision changes, decreased hearing, ear pain, sore throat, breast concerns, chest pain, palpitations, dizziness, syncope, dyspnea on  exertion, cough, swelling, nausea, vomiting, diarrhea, constipation, abdominal pain, melena, hematochezia, indigestion/heartburn, hematuria, incontinence, dysuria, vaginal bleeding, discharge, odor or itch, genital lesions, joint pains, numbness, tingling, weakness, tremor, suspicious skin lesions, depression, abnormal bleeding/bruising, or enlarged lymph nodes.  Hot flashes, infrequently Depression  Hip pain?  PHYSICAL EXAM:  There were no vitals taken for this visit.  Wt Readings from Last 3 Encounters:  10/06/22 180 lb (81.6 kg)  05/05/22 192 lb (87.1 kg)  03/01/22 206 lb 6.4 oz (93.6 kg)    General Appearance:    Alert, cooperative, no distress, appears stated age  Head:    Normocephalic, without obvious abnormality, atraumatic  Eyes:    PERRL, conjunctiva/corneas clear, EOM's intact, fundi benign  Ears:    Normal TM's and external ear canals  Nose:   No drainage or sinus tenderness  Throat:   Normal mucosa  Neck:   Supple, no lymphadenopathy;  thyroid:  no enlargement/ tenderness/nodules; no carotid bruit or JVD  Back:    Spine nontender, no curvature, ROM normal, no CVA tenderness  Lungs:     Clear to auscultation bilaterally without wheezes, rales or ronchi; respirations unlabored  Chest Wall:    No tenderness or deformity   Heart:    Regular rate and rhythm, S1 and S2 normal, no murmur, rub or gallop  Breast Exam:    ***  Abdomen:     Soft, non-tender, nondistended, normoactive bowel sounds, no masses, no hepatosplenomegaly  Genitalia:    normal external genitalia without lesions.  BUS and vagina normal; no  abnormal discharge. No cervical motion tenderness. No uterine or adnexal masses or tenderness. Pap not obtained.  Rectal:   ***  Extremities:   No clubbing, cyanosis or edema. FROM of both hips  Pulses:   2+ and symmetric all extremities  Skin:   Skin color, texture, turgor normal, no rashes or lesions.    Lymph nodes:   Cervical, supraclavicular, inguinal and axillary  nodes normal  Neurologic:   CNII-XII intact, normal strength, sensation and gait; reflexes 2+ and symmetric throughout                Psych:  Normal mood, affect, hygiene, grooming, eye contact, speech     03/01/2022    2:42 PM 01/13/2022    4:06 PM 09/11/2018   10:18 AM 03/09/2018   11:06 AM 12/15/2016    2:25 PM  Depression screen PHQ 2/9  Decreased Interest 0 2 0 1 0  Down, Depressed, Hopeless 0 3 0 0 0  PHQ - 2 Score 0 5 0 1 0  Altered sleeping 0 0  1   Tired, decreased energy 2 3  0   Change in appetite 0 2  0   Feeling bad or failure about yourself  1 3  0   Trouble concentrating 0 2  0   Moving slowly or fidgety/restless 2 0  0   Suicidal thoughts 0 0  0   PHQ-9 Score 5 15  2    Difficult doing work/chores Somewhat difficult         ASSESSMENT/PLAN:  Does she see GYN?  I removed her IUD at CPE last year.  Didn't do pap (done 03/2021) Unsure if she plans to see GYN, or if I need to do her breast/pelvic exam today  Needs u/a, dx h/o recurrent uti Phq-9 Wellbutrin RF I don't think she ever returned the Cologuard, did she? (Said in 04/2022 she had the kit) Likely needs new order, vs changing to colonoscopy referral if she prefers.  If nonfasting, can do A1c. If fasting, can also check fglu. Doesn't really need other labs, unless she needs STD testing, so could do POC if not drawing other labs (vs doing with labs).  Flu, COVID Shingrix rec--NV?  Add depression dx  Discussed monthly self breast exams and yearly mammograms; at least 30 minutes of aerobic activity at least 5 days/week, weight-bearing exercises 2x/week; proper sunscreen use reviewed; healthy diet, including goals of calcium and vitamin D intake and alcohol recommendations (less than or equal to 1 drink/day) reviewed; regular seatbelt use; changing batteries in smoke detectors.   Immunization recommendations discussed--flu shot and COVID booster given today.  Shingrix recommended, to return for NV in 2 weeks  (risks/SE reviewed).   TdaP next year. Colon cancer screening options reviewed. Discussed colonoscopy and Cologuard. Cologuard ordered last year, never returned.

## 2023-01-25 NOTE — Patient Instructions (Signed)
  HEALTH MAINTENANCE RECOMMENDATIONS:  It is recommended that you get at least 30 minutes of aerobic exercise at least 5 days/week (for weight loss, you may need as much as 60-90 minutes). This can be any activity that gets your heart rate up. This can be divided in 10-15 minute intervals if needed, but try and build up your endurance at least once a week.  Weight bearing exercise is also recommended twice weekly.  Eat a healthy diet with lots of vegetables, fruits and fiber.  "Colorful" foods have a lot of vitamins (ie green vegetables, tomatoes, red peppers, etc).  Limit sweet tea, regular sodas and alcoholic beverages, all of which has a lot of calories and sugar.  Up to 1 alcoholic drink daily may be beneficial for women (unless trying to lose weight, watch sugars).  Drink a lot of water.  Calcium recommendations are 1200-1500 mg daily (1500 mg for postmenopausal women or women without ovaries), and vitamin D 1000 IU daily.  This should be obtained from diet and/or supplements (vitamins), and calcium should not be taken all at once, but in divided doses.  Monthly self breast exams and yearly mammograms for women over the age of 31 is recommended.  Sunscreen of at least SPF 30 should be used on all sun-exposed parts of the skin when outside between the hours of 10 am and 4 pm (not just when at beach or pool, but even with exercise, golf, tennis, and yard work!)  Use a sunscreen that says "broad spectrum" so it covers both UVA and UVB rays, and make sure to reapply every 1-2 hours.  Remember to change the batteries in your smoke detectors when changing your clock times in the spring and fall. Carbon monoxide detectors are recommended for your home.  Use your seat belt every time you are in a car, and please drive safely and not be distracted with cell phones and texting while driving.  I recommend getting the new shingles vaccine (Shingrix).  This is a series of 2 injections, spaced 2 months  apart.  It doesn't have to be exactly 2 months apart (but can't be sooner), if that isn't feasible for your schedule, but try and get them close to 2 months (and definitely within 6 months of each other, or else the efficacy of the vaccine drops off). This should be separated from other vaccines by at least 2 weeks. Please schedule a nurse visit at your convenience for these (vs get from the pharmacy).

## 2023-01-26 ENCOUNTER — Encounter: Payer: Managed Care, Other (non HMO) | Admitting: Family Medicine

## 2023-01-26 ENCOUNTER — Encounter: Payer: Self-pay | Admitting: Family Medicine

## 2023-01-26 ENCOUNTER — Ambulatory Visit (INDEPENDENT_AMBULATORY_CARE_PROVIDER_SITE_OTHER): Payer: Self-pay | Admitting: Family Medicine

## 2023-01-26 VITALS — BP 118/72 | HR 70 | Ht 66.0 in | Wt 180.2 lb

## 2023-01-26 DIAGNOSIS — R7301 Impaired fasting glucose: Secondary | ICD-10-CM

## 2023-01-26 DIAGNOSIS — N939 Abnormal uterine and vaginal bleeding, unspecified: Secondary | ICD-10-CM

## 2023-01-26 DIAGNOSIS — Z1211 Encounter for screening for malignant neoplasm of colon: Secondary | ICD-10-CM

## 2023-01-26 DIAGNOSIS — Z5181 Encounter for therapeutic drug level monitoring: Secondary | ICD-10-CM

## 2023-01-26 DIAGNOSIS — Z113 Encounter for screening for infections with a predominantly sexual mode of transmission: Secondary | ICD-10-CM | POA: Diagnosis not present

## 2023-01-26 DIAGNOSIS — Z87440 Personal history of urinary (tract) infections: Secondary | ICD-10-CM

## 2023-01-26 DIAGNOSIS — Z0000 Encounter for general adult medical examination without abnormal findings: Secondary | ICD-10-CM | POA: Diagnosis not present

## 2023-01-26 DIAGNOSIS — K529 Noninfective gastroenteritis and colitis, unspecified: Secondary | ICD-10-CM

## 2023-01-26 DIAGNOSIS — F3340 Major depressive disorder, recurrent, in remission, unspecified: Secondary | ICD-10-CM

## 2023-01-26 DIAGNOSIS — F332 Major depressive disorder, recurrent severe without psychotic features: Secondary | ICD-10-CM

## 2023-01-26 DIAGNOSIS — Z23 Encounter for immunization: Secondary | ICD-10-CM

## 2023-01-26 LAB — POCT URINALYSIS DIP (PROADVANTAGE DEVICE)
Glucose, UA: NEGATIVE mg/dL
Ketones, POC UA: NEGATIVE mg/dL
Leukocytes, UA: NEGATIVE
Nitrite, UA: NEGATIVE
Protein Ur, POC: 30 mg/dL — AB
Specific Gravity, Urine: 1.02
Urobilinogen, Ur: 0.2
pH, UA: 6 (ref 5.0–8.0)

## 2023-01-26 LAB — POCT GLYCOSYLATED HEMOGLOBIN (HGB A1C): Hemoglobin A1C: 5.1 % (ref 4.0–5.6)

## 2023-01-26 LAB — POCT CBG (FASTING - GLUCOSE)-MANUAL ENTRY: Glucose Fasting, POC: 133 mg/dL — AB (ref 70–99)

## 2023-01-26 MED ORDER — BUPROPION HCL ER (XL) 300 MG PO TB24: 300.0000 mg | ORAL_TABLET | Freq: Every day | ORAL | 3 refills | Status: AC

## 2023-01-27 ENCOUNTER — Encounter: Payer: Self-pay | Admitting: Family Medicine

## 2023-01-27 LAB — POCT URINE PREGNANCY: Preg Test, Ur: NEGATIVE

## 2023-01-28 LAB — GC/CHLAMYDIA PROBE AMP
Chlamydia trachomatis, NAA: NEGATIVE
Neisseria Gonorrhoeae by PCR: NEGATIVE

## 2023-01-31 ENCOUNTER — Encounter: Payer: Self-pay | Admitting: Family Medicine

## 2023-02-02 ENCOUNTER — Other Ambulatory Visit (INDEPENDENT_AMBULATORY_CARE_PROVIDER_SITE_OTHER): Payer: Self-pay

## 2023-02-02 DIAGNOSIS — Z23 Encounter for immunization: Secondary | ICD-10-CM | POA: Diagnosis not present

## 2023-02-02 DIAGNOSIS — Z5181 Encounter for therapeutic drug level monitoring: Secondary | ICD-10-CM

## 2023-02-02 DIAGNOSIS — Z113 Encounter for screening for infections with a predominantly sexual mode of transmission: Secondary | ICD-10-CM

## 2023-02-02 DIAGNOSIS — N939 Abnormal uterine and vaginal bleeding, unspecified: Secondary | ICD-10-CM

## 2023-02-02 DIAGNOSIS — R7301 Impaired fasting glucose: Secondary | ICD-10-CM

## 2023-02-03 LAB — HIV ANTIBODY (ROUTINE TESTING W REFLEX): HIV Screen 4th Generation wRfx: NONREACTIVE

## 2023-02-03 LAB — VITAMIN D 25 HYDROXY (VIT D DEFICIENCY, FRACTURES): Vit D, 25-Hydroxy: 58.2 ng/mL (ref 30.0–100.0)

## 2023-02-03 LAB — FOLLICLE STIMULATING HORMONE: FSH: 65.5 m[IU]/mL

## 2023-02-03 LAB — GLUCOSE, RANDOM: Glucose: 91 mg/dL (ref 70–99)

## 2023-02-03 LAB — RPR: RPR Ser Ql: NONREACTIVE

## 2023-02-04 ENCOUNTER — Other Ambulatory Visit: Payer: Self-pay | Admitting: Family Medicine

## 2023-02-04 ENCOUNTER — Encounter: Payer: Self-pay | Admitting: *Deleted

## 2023-02-04 DIAGNOSIS — Z6832 Body mass index (BMI) 32.0-32.9, adult: Secondary | ICD-10-CM

## 2023-02-04 MED ORDER — PHENTERMINE HCL 37.5 MG PO CAPS: ORAL_CAPSULE | ORAL | 0 refills | Status: DC

## 2023-02-04 NOTE — Telephone Encounter (Signed)
Is this okay to refill>? 

## 2023-02-04 NOTE — Telephone Encounter (Signed)
We didn't discuss this at her visit (she didn't ask for it; marked as "not taking" on her med list).  Is she asking for it just to have some help through the holidays? If so, I can give a last #30. But this is NOT an ongoing medication.  It was last filled in July to help her before her daughter's wedding.

## 2023-02-04 NOTE — Telephone Encounter (Signed)
Called twice, vm not set up. Sent MyChart message.

## 2023-02-04 NOTE — Telephone Encounter (Signed)
Sending back for #30, she responded to MyChart message-I forwarded to you.

## 2023-02-25 ENCOUNTER — Other Ambulatory Visit: Payer: Managed Care, Other (non HMO)

## 2023-05-03 ENCOUNTER — Other Ambulatory Visit: Payer: Self-pay | Admitting: Family Medicine

## 2023-05-03 DIAGNOSIS — Z6832 Body mass index (BMI) 32.0-32.9, adult: Secondary | ICD-10-CM

## 2023-05-03 MED ORDER — PHENTERMINE HCL 37.5 MG PO CAPS: ORAL_CAPSULE | ORAL | 0 refills | Status: AC

## 2024-02-02 ENCOUNTER — Encounter: Payer: Self-pay | Admitting: Family Medicine
